# Patient Record
Sex: Male | Born: 1995 | Race: Black or African American | Hispanic: No | Marital: Single | Smoking: Never smoker
Health system: Southern US, Community
[De-identification: ages and names within clinical notes are randomized; demographics above are authoritative.]

## PROBLEM LIST (undated history)

## (undated) DIAGNOSIS — F909 Attention-deficit hyperactivity disorder, unspecified type: Secondary | ICD-10-CM

## (undated) DIAGNOSIS — J302 Other seasonal allergic rhinitis: Secondary | ICD-10-CM

## (undated) DIAGNOSIS — Q999 Chromosomal abnormality, unspecified: Secondary | ICD-10-CM

## (undated) DIAGNOSIS — E119 Type 2 diabetes mellitus without complications: Secondary | ICD-10-CM

## (undated) DIAGNOSIS — J40 Bronchitis, not specified as acute or chronic: Secondary | ICD-10-CM

## (undated) DIAGNOSIS — F913 Oppositional defiant disorder: Secondary | ICD-10-CM

## (undated) DIAGNOSIS — G4733 Obstructive sleep apnea (adult) (pediatric): Secondary | ICD-10-CM

## (undated) HISTORY — DX: Bronchitis, not specified as acute or chronic: J40

## (undated) HISTORY — DX: Oppositional defiant disorder: F91.3

## (undated) HISTORY — DX: Attention-deficit hyperactivity disorder, unspecified type: F90.9

## (undated) HISTORY — DX: Other seasonal allergic rhinitis: J30.2

## (undated) HISTORY — DX: Type 2 diabetes mellitus without complications: E11.9

## (undated) HISTORY — DX: Chromosomal abnormality, unspecified: Q99.9

## (undated) HISTORY — DX: Obstructive sleep apnea (adult) (pediatric): G47.33

## (undated) HISTORY — PX: TONSILLECTOMY AND ADENOIDECTOMY: SUR1326

## (undated) HISTORY — PX: OTHER SURGICAL HISTORY: SHX169

---
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
## 2015-01-01 ENCOUNTER — Ambulatory Visit (INDEPENDENT_AMBULATORY_CARE_PROVIDER_SITE_OTHER): Payer: MEDICAID | Admitting: Psychiatry

## 2015-01-01 ENCOUNTER — Encounter (HOSPITAL_COMMUNITY): Payer: Self-pay | Admitting: Psychiatry

## 2015-01-01 VITALS — BP 127/65 | HR 76 | Ht 67.81 in | Wt 189.4 lb

## 2015-01-01 DIAGNOSIS — F902 Attention-deficit hyperactivity disorder, combined type: Secondary | ICD-10-CM | POA: Diagnosis not present

## 2015-01-01 DIAGNOSIS — F913 Oppositional defiant disorder: Secondary | ICD-10-CM | POA: Diagnosis not present

## 2015-01-01 DIAGNOSIS — F39 Unspecified mood [affective] disorder: Secondary | ICD-10-CM

## 2015-01-01 MED ORDER — DEXMETHYLPHENIDATE HCL ER 35 MG PO CP24: 35.0000 mg | ORAL_CAPSULE | ORAL | Status: DC

## 2015-01-01 MED ORDER — GUANFACINE HCL ER 4 MG PO TB24: 4.0000 mg | ORAL_TABLET | ORAL | Status: DC

## 2015-01-01 MED ORDER — DEXMETHYLPHENIDATE HCL ER 35 MG PO CP24: ORAL_CAPSULE | ORAL | Status: DC

## 2015-01-01 NOTE — Progress Notes (Signed)
Patient ID: Aaron Dean, male   DOB: [birth year], 19 y.o.   MRN: [id number] Patient ID: Aaron Dean, male   DOB: [birth year], 19 y.o.   MRN: [id number] Patient ID: Aaron Dean, male   DOB: [birth year], 19 y.o.   MRN: [id number] Patient ID: Aaron Dean, male   DOB: [birth year], 19 y.o.   MRN: [id number] Patient ID: Aaron Dean, male   DOB: [birth year], 19 y.o.   MRN: [id number] Patient ID: Aaron Dean, male   DOB: [birth year], 19 y.o.   MRN: [id number] Patient ID: Aaron Dean, male   DOB: [birth year], 19 y.o.   MRN: [id number] [facility] [phone] Progress Note Aaron Dean MRN: [id number] DOB: [birth year] Age: 19 y.o.  Date: 01/01/2015 Start Time: 3:30 PM End Time: 3:55 PM  Chief Complaint: Chief Complaint  Patient presents with  . ADHD  . Follow-up   Subjective: "I am doing okay"  This patient is a 19 year-old black male who lives with his mother grandmother and 2 uncles in [place]. 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  Apparently the patient was born at 28 weeks due to placenta disruption. He was in the NICU for 4 months. He had delayed milestones and needed speech and physical therapy. 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  The patient used to have a lot of disruptive agitated behaviors but he is doing much better now on a combination of intuitive and Focalin he's not had any significant side effects. He does have an IEP at school and his grades are usually C's and D's  The patient returns for followup after 3 months. For the most part he is doing okay . Grades are okay except a D in [place]. He not had any behavioral problems at home or school     Suicidal Ideation: No Plan Formed: No Patient has means to carry out plan: No  Homicidal Ideation: No Plan Formed: No Patient has means to carry out plan: No  Review of  Systems: Psychiatric: Agitation: No Hallucination: No Depressed Mood: No Insomnia: No Hypersomnia: No Altered Concentration: No Feels Worthless: No Grandiose Ideas: No Belief In Special Powers: No New/Increased Substance Abuse: No Compulsions: No  Neurologic: Headache: No Seizure: No Paresthesias: No  Past Medical Family, Social History: In the 12th grade at [employer] and has an IEP Allergies: Allergies  Allergen Reactions  . Abilify [Aripiprazole] Other (See Comments)    Altered mental state  . Amoxicillin-Pot Clavulanate Rash   Medical History: Past Medical History  Diagnosis Date  . ADHD (attention deficit hyperactivity disorder)   . Oppositional defiant disorder   . Premature birth   . Bronchitis   . Chromosomal abnormality   . Sleep apnoea, obstructed   . Seasonal allergies   . Diabetes mellitus type II   . Asthma     seasonal related   Surgical History: Past Surgical History  Procedure Laterality Date  . Tubes in ears during early childhood    . Tonsillectomy and adenoidectomy     Family History: family history includes Alcohol abuse in his father; Anxiety disorder in his maternal grandmother; Autism spectrum disorder in his cousin; Drug abuse in his father; Seizures in his paternal uncle. There is no history of ADD / ADHD, Bipolar disorder, Dementia, Depression, OCD, Paranoid behavior, Schizophrenia, Physical abuse, or Sexual abuse. Reviewed and nothing  new today.  Outpatient Encounter Prescriptions as of 01/01/2015  Medication Sig  . cetirizine (ZYRTEC) 10 MG tablet Take 10 mg by mouth daily.  Marland Kitchen FOCALIN XR 15 MG 24 hr capsule Take 15 mg by mouth every morning.  Marland Kitchen FOCALIN XR 20 MG 24 hr capsule Take 20 mg by mouth every morning.  Marland Kitchen guaiFENesin (DIABETIC TUSSIN EX) 100 MG/5ML liquid Take 200 mg by mouth once as needed. For cough/cold  . guanFACINE (INTUNIV) 4 MG TB24 SR tablet Take 1 tablet (4 mg total) by mouth every morning.  . metFORMIN  (GLUCOPHAGE-XR) 750 MG 24 hr tablet Take 1,500 mg by mouth daily with breakfast.  . tretinoin (RETIN-A) 0.025 % cream Apply 1 application topically daily as needed. For skin breakouts  . [DISCONTINUED] guanFACINE (INTUNIV) 4 MG TB24 SR tablet Take 1 tablet (4 mg total) by mouth every morning.  Marland Kitchen Dexmethylphenidate HCl 35 MG CP24 Take 35 mg by mouth every morning.  Marland Kitchen Dexmethylphenidate HCl 35 MG CP24 Take one each am  . Dexmethylphenidate HCl 35 MG CP24 Take 35 mg by mouth every morning.  . [DISCONTINUED] Dexmethylphenidate HCl 35 MG CP24 Take one each am (Patient not taking: Reported on 01/01/2015)  . [DISCONTINUED] Dexmethylphenidate HCl 35 MG CP24 Take 35 mg by mouth every morning. (Patient not taking: Reported on 01/01/2015)  . [DISCONTINUED] Dexmethylphenidate HCl 35 MG CP24 Take 35 mg by mouth every morning. (Patient not taking: Reported on 01/01/2015)    Past Psychiatric History/Hospitalization(s): Anxiety: No Bipolar Disorder: Yes Depression: No Mania: No Psychosis: No Schizophrenia: No Personality Disorder: No Hospitalization for psychiatric illness: No History of Electroconvulsive Shock Therapy: No Prior Suicide Attempts: No  Physical Exam: Constitutional:  BP 127/65 mmHg  Pulse 76  Ht 5' 7.81" (1.722 m)  Wt 189 lb 6.4 oz (85.911 kg)  BMI 28.97 kg/m2  General Appearance: alert, oriented, no acute distress and obese  Musculoskeletal: Strength & Muscle Tone: within normal limits Gait & Station: normal Patient leans: N/A  Psychiatric: Speech (describe rate, volume, coherence, spontaneity, and abnormalities if any): Normal in volume, rate, tone, spontaneous   Thought Process (describe rate, content, abstract reasoning, and computation): Organized, goal directed, age appropriate   Associations: Intact  Thoughts: normal  Mental Status: Orientation: oriented to person, place and situation Mood & Affect: normal affect Attention Span & Concentration: OK  Lab Results:   No results found for this or any previous visit (from the past 8736 hour(s)).Labs are good.  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  I reviewed CC, tobacco/med/surg Hx, meds effects/ side effects, problem list, therapies and responses as well as current situation/symptoms discussed options. He'll continue his current medications and return in 3 months See orders and pt instructions for more details.  MEDICATIONS this encounter: Meds ordered this encounter  Medications  . FOCALIN XR 20 MG 24 hr capsule    Sig: Take 20 mg by mouth every morning.    Refill:  0  . FOCALIN XR 15 MG 24 hr capsule    Sig: Take 15 mg by mouth every morning.    Refill:  0  . guanFACINE (INTUNIV) 4 MG TB24 SR tablet    Sig: Take 1 tablet (4 mg total) by mouth every morning.  Dispense:  30 tablet    Refill:  2  . Dexmethylphenidate HCl 35 MG CP24    Sig: Take 35 mg by mouth every morning.    Dispense:  30 capsule    Refill:  0    Do not fill before 02/01/15  . Dexmethylphenidate HCl 35 MG CP24    Sig: Take one each am    Dispense:  30 capsule    Refill:  0  . Dexmethylphenidate HCl 35 MG CP24    Sig: Take 35 mg by mouth every morning.    Dispense:  30 capsule    Refill:  0    Do not fill before 03/02/15    Medical Decision Making Problem Points:  Established problem, worsening (2), Review of last therapy session (1) and Review of psycho-social stressors (1) Data Points:  Review or order clinical lab tests (1) Review of medication regiment & side effects (2) Review of new medications or change in dosage (2)  I certify that outpatient services furnished can reasonably be expected to improve the patient's condition.   [name],  MD

## 2015-04-02 ENCOUNTER — Ambulatory Visit (INDEPENDENT_AMBULATORY_CARE_PROVIDER_SITE_OTHER): Payer: MEDICAID | Admitting: Psychiatry

## 2015-04-02 ENCOUNTER — Encounter (HOSPITAL_COMMUNITY): Payer: Self-pay | Admitting: Psychiatry

## 2015-04-02 VITALS — Ht 67.0 in | Wt 201.0 lb

## 2015-04-02 DIAGNOSIS — F39 Unspecified mood [affective] disorder: Secondary | ICD-10-CM | POA: Diagnosis not present

## 2015-04-02 DIAGNOSIS — F902 Attention-deficit hyperactivity disorder, combined type: Secondary | ICD-10-CM

## 2015-04-02 DIAGNOSIS — F913 Oppositional defiant disorder: Secondary | ICD-10-CM

## 2015-04-02 MED ORDER — GUANFACINE HCL ER 4 MG PO TB24: 4.0000 mg | ORAL_TABLET | ORAL | Status: DC

## 2015-04-02 MED ORDER — DEXMETHYLPHENIDATE HCL ER 35 MG PO CP24: 35.0000 mg | ORAL_CAPSULE | ORAL | Status: DC

## 2015-04-02 MED ORDER — DEXMETHYLPHENIDATE HCL ER 35 MG PO CP24: ORAL_CAPSULE | ORAL | Status: DC

## 2015-04-02 NOTE — Progress Notes (Signed)
Patient ID: Aaron CoppBraxton L Talamantez, male   DOB: 1996/12/05, 19 y.o.   MRN: [id number] Patient ID: Aaron CoppBraxton L Henslee, male   DOB: 1996/12/05, 19 y.o.   MRN: [id number] Patient ID: Aaron CoppBraxton L Larose, male   DOB: 1996/12/05, 19 y.o.   MRN: [id number] Patient ID: Aaron CoppBraxton L Georgiades, male   DOB: 1996/12/05, 19 y.o.   MRN: [id number] Patient ID: Aaron CoppBraxton L Texidor, male   DOB: 1996/12/05, 19 y.o.   MRN: [id number] Patient ID: Aaron CoppBraxton L Lahm, male   DOB: 1996/12/05, 19 y.o.   MRN: [id number] Patient ID: Aaron CoppBraxton L Slusher, male   DOB: 1996/12/05, 19 y.o.   MRN: [id number] Patient ID: Aaron CoppBraxton L Wild, male   DOB: 1996/12/05, 19 y.o.   MRN: [id number] [facility] [id number] Progress Note Aaron Dean MRN: [id number] DOB: 1996/12/05 Age: 19 y.o.  Date: 04/02/2015 Start Time: 3:30 PM End Time: 3:55 PM  Chief Complaint: Chief Complaint  Patient presents with  . ADHD  . Follow-up   Subjective: "I am doing okay"  This patient is a 19 year-old black male who lives with his mother grandmother and 2 uncles in [place]. 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  Apparently the patient was born at 28 weeks due to placenta disruption. He was in the NICU for 4 months. He had delayed milestones and needed speech and physical therapy. 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  The patient used to have a lot of disruptive agitated behaviors but he is doing much better now on a combination of intuitive and Focalin he's not had any significant side effects. He does have an IEP at school and his grades are usually C's and D's  The patient returns for followup after 3 months. For the most part he is doing okay his grades have dropped as hebrokehis chrome book and couldn't use it for a while it has now been fixed any plans to bring his grades up He not had any behavioral problems at home or school     Suicidal Ideation: No Plan  Formed: No Patient has means to carry out plan: No  Homicidal Ideation: No Plan Formed: No Patient has means to carry out plan: No  Review of Systems: Psychiatric: Agitation: No Hallucination: No Depressed Mood: No Insomnia: No Hypersomnia: No Altered Concentration: No Feels Worthless: No Grandiose Ideas: No Belief In Special Powers: No New/Increased Substance Abuse: No Compulsions: No  Neurologic: Headache: No Seizure: No Paresthesias: No  Past Medical Family, Social History: In the 12th grade at [employer] and has an IEP Allergies: Allergies  Allergen Reactions  . Abilify [Aripiprazole] Other (See Comments)    Altered mental state  . Amoxicillin-Pot Clavulanate Rash   Medical History: Past Medical History  Diagnosis Date  . ADHD (attention deficit hyperactivity disorder)   . Oppositional defiant disorder   . Premature birth   . Bronchitis   . Chromosomal abnormality   . Sleep apnoea, obstructed   . Seasonal allergies   . Diabetes mellitus type II   . Asthma     seasonal related   Surgical History: Past Surgical History  Procedure Laterality Date  . Tubes in ears during early childhood    . Tonsillectomy and adenoidectomy     Family History: family history includes Alcohol abuse in his father; Anxiety disorder in his maternal grandmother; Autism spectrum disorder in his cousin;  Drug abuse in his father; Seizures in his paternal uncle. There is no history of ADD / ADHD, Bipolar disorder, Dementia, Depression, OCD, Paranoid behavior, Schizophrenia, Physical abuse, or Sexual abuse. Reviewed and nothing new today.  Outpatient Encounter Prescriptions as of 04/02/2015  Medication Sig  . cetirizine (ZYRTEC) 10 MG tablet Take 10 mg by mouth daily.  Marland Kitchen Dexmethylphenidate HCl 35 MG CP24 Take 35 mg by mouth every morning.  Marland Kitchen Dexmethylphenidate HCl 35 MG CP24 Take 35 mg by mouth every morning.  Marland Kitchen Dexmethylphenidate HCl 35 MG CP24 Take one each am  . FOCALIN  XR 15 MG 24 hr capsule Take 15 mg by mouth every morning.  Marland Kitchen FOCALIN XR 20 MG 24 hr capsule Take 20 mg by mouth every morning.  Marland Kitchen guaiFENesin (DIABETIC TUSSIN EX) 100 MG/5ML liquid Take 200 mg by mouth once as needed. For cough/cold  . guanFACINE (INTUNIV) 4 MG TB24 SR tablet Take 1 tablet (4 mg total) by mouth every morning.  . metFORMIN (GLUCOPHAGE-XR) 750 MG 24 hr tablet Take 1,500 mg by mouth daily with breakfast.  . tretinoin (RETIN-A) 0.025 % cream Apply 1 application topically daily as needed. For skin breakouts  . [DISCONTINUED] Dexmethylphenidate HCl 35 MG CP24 Take 35 mg by mouth every morning.  . [DISCONTINUED] Dexmethylphenidate HCl 35 MG CP24 Take one each am  . [DISCONTINUED] Dexmethylphenidate HCl 35 MG CP24 Take 35 mg by mouth every morning.  . [DISCONTINUED] guanFACINE (INTUNIV) 4 MG TB24 SR tablet Take 1 tablet (4 mg total) by mouth every morning.    Past Psychiatric History/Hospitalization(s): Anxiety: No Bipolar Disorder: Yes Depression: No Mania: No Psychosis: No Schizophrenia: No Personality Disorder: No Hospitalization for psychiatric illness: No History of Electroconvulsive Shock Therapy: No Prior Suicide Attempts: No  Physical Exam: Constitutional:  Ht  (1.702 m)  Wt 201 lb (91.173 kg)  BMI 31.47 kg/m2  General Appearance: alert, oriented, no acute distress and obese  Musculoskeletal: Strength & Muscle Tone: within normal limits Gait & Station: normal Patient leans: N/A  Psychiatric: Speech (describe rate, volume, coherence, spontaneity, and abnormalities if any): Normal in volume, rate, tone, spontaneous   Thought Process (describe rate, content, abstract reasoning, and computation): Organized, goal directed, age appropriate   Associations: Intact  Thoughts: normal  Mental Status: Orientation: oriented to person, place and situation Mood & Affect: normal affect Attention Span & Concentration: OK  Lab Results:  No results found for  this or any previous visit (from the past 8736 hour(s)).Labs are good.  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  I reviewed CC, tobacco/med/surg Hx, meds effects/ side effects, problem list, therapies and responses as well as current situation/symptoms discussed options. He'll continue his current medications and return in 3 months See orders and pt instructions for more details.  MEDICATIONS this encounter: Meds ordered this encounter  Medications  . guanFACINE (INTUNIV) 4 MG TB24 SR tablet    Sig: Take 1 tablet (4 mg total) by mouth every morning.    Dispense:  30 tablet    Refill:  2  . Dexmethylphenidate HCl 35 MG CP24    Sig: Take 35 mg by mouth every morning.    Dispense:  30 capsule    Refill:  0  . Dexmethylphenidate HCl  35 MG CP24    Sig: Take 35 mg by mouth every morning.    Dispense:  30 capsule    Refill:  0    Do not fill before 05/02/15  . Dexmethylphenidate HCl 35 MG CP24    Sig: Take one each am    Dispense:  30 capsule    Refill:  0    Do not fill before 06/02/15    Medical Decision Making Problem Points:  Established problem, worsening (2), Review of last therapy session (1) and Review of psycho-social stressors (1) Data Points:  Review or order clinical lab tests (1) Review of medication regiment & side effects (2) Review of new medications or change in dosage (2)  I certify that outpatient services furnished can reasonably be expected to improve the patient's condition.   [name], MD

## 2015-06-29 ENCOUNTER — Other Ambulatory Visit (HOSPITAL_COMMUNITY): Payer: Self-pay | Admitting: Psychiatry

## 2015-06-30 ENCOUNTER — Other Ambulatory Visit (HOSPITAL_COMMUNITY): Payer: Self-pay | Admitting: Psychiatry

## 2015-06-30 ENCOUNTER — Telehealth (HOSPITAL_COMMUNITY): Payer: Self-pay | Admitting: *Deleted

## 2015-06-30 MED ORDER — DEXMETHYLPHENIDATE HCL ER 35 MG PO CP24
35.0000 mg | ORAL_CAPSULE | ORAL | Status: DC
Start: 2015-06-30 — End: 2015-07-12

## 2015-06-30 NOTE — Telephone Encounter (Signed)
printed

## 2015-06-30 NOTE — Telephone Encounter (Signed)
patient request a prescription for Focalin.  He will be one pill short.    He is scheduled to be seen on Friday.

## 2015-07-01 ENCOUNTER — Telehealth (HOSPITAL_COMMUNITY): Payer: Self-pay | Admitting: *Deleted

## 2015-07-01 NOTE — Telephone Encounter (Signed)
Pt mother Aaron Dean came into office to pick up pt printed script that was requested. Pt mother agreed with script. Pt mother D/L number is [phone] and exp date of 04-28-22.

## 2015-07-01 NOTE — Telephone Encounter (Signed)
Informed pt about printed script. Pt showed understanding

## 2015-07-02 ENCOUNTER — Ambulatory Visit (HOSPITAL_COMMUNITY): Payer: Self-pay | Admitting: Psychiatry

## 2015-07-05 ENCOUNTER — Telehealth (HOSPITAL_COMMUNITY): Payer: Self-pay | Admitting: *Deleted

## 2015-07-05 NOTE — Telephone Encounter (Signed)
VOICE MESSAGE FROM LESLIE CARTER, [place] NEED REFILL OF INTUNIV.   HIS MEDICAID HAS BEEN APPROVED.   HE HAS BEEN OUT FOR FOUR DAYS.

## 2015-07-06 ENCOUNTER — Other Ambulatory Visit (HOSPITAL_COMMUNITY): Payer: Self-pay | Admitting: Psychiatry

## 2015-07-06 ENCOUNTER — Telehealth (HOSPITAL_COMMUNITY): Payer: Self-pay | Admitting: *Deleted

## 2015-07-06 DIAGNOSIS — F902 Attention-deficit hyperactivity disorder, combined type: Secondary | ICD-10-CM

## 2015-07-06 DIAGNOSIS — F913 Oppositional defiant disorder: Secondary | ICD-10-CM

## 2015-07-06 MED ORDER — GUANFACINE HCL ER 4 MG PO TB24: 4.0000 mg | ORAL_TABLET | ORAL | Status: DC

## 2015-07-06 NOTE — Telephone Encounter (Signed)
FAX REQUEST FOR GUANFACINE HCL ER 4 MT. TABLET

## 2015-07-06 NOTE — Telephone Encounter (Signed)
sent 

## 2015-07-06 NOTE — Telephone Encounter (Signed)
Already sent.

## 2015-07-12 ENCOUNTER — Ambulatory Visit (INDEPENDENT_AMBULATORY_CARE_PROVIDER_SITE_OTHER): Payer: Medicaid Other | Admitting: Psychiatry

## 2015-07-12 ENCOUNTER — Ambulatory Visit (HOSPITAL_COMMUNITY): Payer: Self-pay | Admitting: Psychiatry

## 2015-07-12 ENCOUNTER — Encounter (HOSPITAL_COMMUNITY): Payer: Self-pay | Admitting: Psychiatry

## 2015-07-12 VITALS — BP 120/80 | Ht 66.5 in | Wt 188.0 lb

## 2015-07-12 DIAGNOSIS — F902 Attention-deficit hyperactivity disorder, combined type: Secondary | ICD-10-CM

## 2015-07-12 DIAGNOSIS — F913 Oppositional defiant disorder: Secondary | ICD-10-CM

## 2015-07-12 DIAGNOSIS — F39 Unspecified mood [affective] disorder: Secondary | ICD-10-CM | POA: Diagnosis not present

## 2015-07-12 MED ORDER — DEXMETHYLPHENIDATE HCL ER 35 MG PO CP24: 35.0000 mg | ORAL_CAPSULE | ORAL | Status: DC

## 2015-07-12 MED ORDER — DEXMETHYLPHENIDATE HCL ER 35 MG PO CP24: ORAL_CAPSULE | ORAL | Status: DC

## 2015-07-12 MED ORDER — GUANFACINE HCL ER 4 MG PO TB24: 4.0000 mg | ORAL_TABLET | ORAL | Status: DC

## 2015-07-12 NOTE — Progress Notes (Signed)
Patient ID: Aaron Dean, male   DOB: 1996/05/21, 19 y.o.   MRN: [id number] Patient ID: Aaron Dean, male   DOB: [birth year], 19 y.o.   MRN: [id number] Patient ID: Aaron Dean, male   DOB: 28-Apr-1996, 19 y.o.   MRN: [id number] Patient ID: Aaron Dean, male   DOB: [birth year], 19 y.o.   MRN: [id number] Patient ID: Aaron Dean, male   DOB: [birth year], 19 y.o.   MRN: [id number] Patient ID: Aaron Dean, male   DOB: 06-Dec-1996, 19 y.o.   MRN: [id number] Patient ID: Aaron Dean, male   DOB: 09/05/1996, 19 y.o.   MRN: [id number] Patient ID: Aaron Dean, male   DOB: April 10, 1996, 19 y.o.   MRN: [id number] Patient ID: Aaron Dean, male   DOB: 1996/10/24, 19 y.o.   MRN: [id number] [facility] [id number] Progress Note Aaron Dean MRN: [id number] DOB: July 08, 1996 Age: 19 y.o.  Date: 07/12/2015 Start Time: 3:30 PM End Time: 3:55 PM  Chief Complaint: Chief Complaint  Patient presents with  . ADHD  . Follow-up   Subjective: "I am doing okay"  This patient is a 19 year-old black male who lives with his mother grandmother and 2 uncles in [place]. He just completed the 12th grader at [employer].  Apparently the patient was born at 28 weeks due to placenta disruption. He was in the NICU for 4 months. He had delayed milestones and needed speech and physical therapy. 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  The patient used to have a lot of disruptive agitated behaviors but he is doing much better now on a combination of intuitive and Focalin he's not had any significant side effects. He does have an IEP at school and his grades are usually C's and D's  The patient returns for followup after 3 months. He just completed the 12th grade and actually did okay with mostly B's and C's in a couple of these. He is now going to a remedial program at [employer]. Eventually he will start college courses. He is not yet driving and his mother takes him to school. He has been behaving well at home     Suicidal Ideation: No Plan Formed: No Patient has means to carry out plan: No  Homicidal Ideation: No Plan Formed: No Patient has means to carry out plan: No  Review of Systems: Psychiatric: Agitation: No Hallucination: No Depressed Mood: No Insomnia: No Hypersomnia: No Altered Concentration: No Feels Worthless: No Grandiose Ideas: No Belief In Special Powers: No New/Increased Substance Abuse: No Compulsions: No  Neurologic: Headache: No Seizure: No Paresthesias: No  Past Medical Family, Social History: In the 12th grade at [employer] school and has an IEP Allergies: Allergies  Allergen Reactions  . Abilify [Aripiprazole] Other (See Comments)    Altered mental state  . Amoxicillin-Pot Clavulanate Rash   Medical History: Past Medical History  Diagnosis Date  . ADHD (attention deficit hyperactivity disorder)   . Oppositional defiant disorder   . Premature birth   . Bronchitis   . Chromosomal abnormality   . Sleep apnoea, obstructed   . Seasonal allergies   . Diabetes mellitus type II   . Asthma     seasonal related   Surgical History: Past Surgical History  Procedure Laterality Date  . Tubes in ears during early childhood    . Tonsillectomy  and adenoidectomy     Family History: family history includes Alcohol abuse in his father; Anxiety disorder in his maternal grandmother; Autism spectrum disorder in his cousin; Drug abuse in his father; Seizures in his paternal uncle. There is no history of ADD / ADHD, Bipolar disorder, Dementia, Depression, OCD, Paranoid behavior, Schizophrenia, Physical abuse, or Sexual abuse. Reviewed and nothing new today.  Outpatient Encounter Prescriptions as of 07/12/2015  Medication Sig  . cetirizine (ZYRTEC) 10 MG tablet Take 10 mg by mouth daily.  Marland Kitchen. Dexmethylphenidate HCl 35 MG  CP24 Take 35 mg by mouth every morning.  Marland Kitchen. Dexmethylphenidate HCl 35 MG CP24 Take one each am  . Dexmethylphenidate HCl 35 MG CP24 Take 35 mg by mouth every morning.  Marland Kitchen. guaiFENesin (DIABETIC TUSSIN EX) 100 MG/5ML liquid Take 200 mg by mouth once as needed. For cough/cold  . guanFACINE (INTUNIV) 4 MG TB24 SR tablet Take 1 tablet (4 mg total) by mouth every morning.  . metFORMIN (GLUCOPHAGE-XR) 750 MG 24 hr tablet Take 1,500 mg by mouth daily with breakfast.  . tretinoin (RETIN-A) 0.025 % cream Apply 1 application topically daily as needed. For skin breakouts  . [DISCONTINUED] Dexmethylphenidate HCl 35 MG CP24 Take 35 mg by mouth every morning.  . [DISCONTINUED] Dexmethylphenidate HCl 35 MG CP24 Take one each am  . [DISCONTINUED] Dexmethylphenidate HCl 35 MG CP24 Take 35 mg by mouth every morning.  . [DISCONTINUED] FOCALIN XR 15 MG 24 hr capsule Take 15 mg by mouth every morning.  . [DISCONTINUED] FOCALIN XR 20 MG 24 hr capsule Take 20 mg by mouth every morning.  . [DISCONTINUED] guanFACINE (INTUNIV) 4 MG TB24 SR tablet Take 1 tablet (4 mg total) by mouth every morning.   No facility-administered encounter medications on file as of 07/12/2015.    Past Psychiatric History/Hospitalization(s): Anxiety: No Bipolar Disorder: Yes Depression: No Mania: No Psychosis: No Schizophrenia: No Personality Disorder: No Hospitalization for psychiatric illness: No History of Electroconvulsive Shock Therapy: No Prior Suicide Attempts: No  Physical Exam: Constitutional:  BP 120/80 mmHg  Ht 5' 6.5" (1.689 m)  Wt 188 lb (85.276 kg)  BMI 29.89 kg/m2  General Appearance: alert, oriented, no acute distress and obese  Musculoskeletal: Strength & Muscle Tone: within normal limits Gait & Station: normal Patient leans: N/A  Psychiatric: Speech (describe rate, volume, coherence, spontaneity, and abnormalities if any): Normal in volume, rate, tone, spontaneous   Thought Process (describe rate, content,  abstract reasoning, and computation): Organized, goal directed, age appropriate   Associations: Intact  Thoughts: normal  Mental Status: Orientation: oriented to person, place and situation Mood & Affect: normal affect Attention Span & Concentration: OK  Lab Results:  No results found for this or any previous visit (from the past 8736 hour(s)).Labs are good.  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  I reviewed CC, tobacco/med/surg Hx, meds effects/ side effects, problem list, therapies and responses as well as current situation/symptoms discussed options. He'll continue Focalin and Intuniv for ADHD symptoms and return in 3 months See orders and pt instructions for more details.  MEDICATIONS this encounter: Meds ordered this encounter  Medications  . guanFACINE (INTUNIV) 4 MG TB24 SR tablet    Sig: Take 1 tablet (4 mg total) by mouth every morning.  Dispense:  30 tablet    Refill:  2  . Dexmethylphenidate HCl 35 MG CP24    Sig: Take 35 mg by mouth every morning.    Dispense:  30 capsule    Refill:  0  . Dexmethylphenidate HCl 35 MG CP24    Sig: Take one each am    Dispense:  30 capsule    Refill:  0    Do not fill before 08/12/15  . Dexmethylphenidate HCl 35 MG CP24    Sig: Take 35 mg by mouth every morning.    Dispense:  30 capsule    Refill:  0    Do not fill before 09/12/15    Medical Decision Making Problem Points:  Established problem, worsening (2), Review of last therapy session (1) and Review of psycho-social stressors (1) Data Points:  Review or order clinical lab tests (1) Review of medication regiment & side effects (2) Review of new medications or change in dosage (2)  I certify that  outpatient services furnished can reasonably be expected to improve the patient's condition.   [name], MD

## 2015-07-16 ENCOUNTER — Telehealth (HOSPITAL_COMMUNITY): Payer: Self-pay | Admitting: *Deleted

## 2015-07-16 NOTE — Telephone Encounter (Signed)
Please schedule him with Gigi Gin, she has seen him before

## 2015-07-16 NOTE — Telephone Encounter (Signed)
phone call from Patient's grandmother passed yesterday, and he keep crying.  she wants to know what to do.

## 2015-07-16 NOTE — Telephone Encounter (Signed)
Scheduled appt with pt mother and she showed understanding

## 2015-08-05 ENCOUNTER — Ambulatory Visit (INDEPENDENT_AMBULATORY_CARE_PROVIDER_SITE_OTHER): Payer: Medicaid Other | Admitting: Psychiatry

## 2015-08-05 DIAGNOSIS — F4321 Adjustment disorder with depressed mood: Secondary | ICD-10-CM

## 2015-08-05 NOTE — Patient Instructions (Signed)
Discussed orally 

## 2015-08-05 NOTE — Progress Notes (Signed)
   THERAPIST PROGRESS NOTE  Session Time: Thursday 06/04/2014 4:05 PM - 4:30 PM  Participation Level: Active  Behavioral Response: CasualAlert/Sad  Type of Therapy: Individual Therapy  Treatment Goals addressed:  Begin healthy grieving process around the loss of grandmother  Interventions: Supportive  Summary: Aaron Dean is a 19 y.o. male who presents with a long-standing history of ADHD and ODD. He last was seen in June 2015.  Psychiatrist Dr. Tenny Craw has referred patient for services to cope with the recent death of his grandmother. Per patient's report, his 73 year old grandmother died on 08/13/2015. Patient had a very close relationship with grandmother who resided with patient and his mother. She had serious health issues and multiple hospitalizations. Patient reports becoming upset and "losing it" when he heard about her death. He states he is better now as he has people around him who care about him. He states not wanting to talk about her too much because he may start crying. He says he is glad she saw him graduate. He also states he knew she wouldn't live long after her graduation but did not think it would happen this quick. He says he keeps remembering her words of telling him to keep going should something happen to her. He is starting [employer] for regular college classes next week and says his grandmother wanted him to go to college. Patient also shares positive memories of his grandmother.    Suicidal/Homicidal: No  Therapist Response: Therapist works with patient to process feelings, tell the story of the loss of his grandmother, discuss the rituals he participated in to grieve the loss of his grandmother, discuss the role of grieving and expression of feelings in managing the loss,   Plan:  Patient thanks therapist for listening today but says he is much better. He states he thinks he has gotten out his feelings and declines offer to schedule another app[ointment. Therapist  encourages patient to contact this practice should he decide to pursue therapy in the future.   Diagnosis: Axis I: Adjustment Disorder with Depressed Mood    ADHD         Axis II: No diagnosis    BYNUM,PEGGY, LCSW 08/05/2015

## 2015-10-02 ENCOUNTER — Other Ambulatory Visit (HOSPITAL_COMMUNITY): Payer: Self-pay | Admitting: Psychiatry

## 2015-10-06 ENCOUNTER — Other Ambulatory Visit (HOSPITAL_COMMUNITY): Payer: Self-pay | Admitting: Psychiatry

## 2015-10-06 ENCOUNTER — Telehealth (HOSPITAL_COMMUNITY): Payer: Self-pay | Admitting: *Deleted

## 2015-10-06 DIAGNOSIS — F902 Attention-deficit hyperactivity disorder, combined type: Secondary | ICD-10-CM

## 2015-10-06 DIAGNOSIS — F913 Oppositional defiant disorder: Secondary | ICD-10-CM

## 2015-10-06 MED ORDER — GUANFACINE HCL ER 4 MG PO TB24: 4.0000 mg | ORAL_TABLET | ORAL | Status: DC

## 2015-10-06 NOTE — Telephone Encounter (Signed)
sent 

## 2015-10-06 NOTE — Telephone Encounter (Signed)
noted 

## 2015-10-06 NOTE — Telephone Encounter (Signed)
Phone call from patient's mother, she said the pharmacy said his Intuniv was last given on 09/02/15.    He is out of Intuniv and need a refill.

## 2015-10-06 NOTE — Telephone Encounter (Signed)
Pt and his mother called stating pt is out of his Intuniv. Informed pt and his mother that per pt chart, pt should run out of his medication on 10-12-15. Called pt pharmacy and she informed office that pt last pick up was 09-02-15 and he does not have any refills on hold. Pt mother number is [phone].

## 2015-10-06 NOTE — Telephone Encounter (Signed)
Called Molly at pt pharmacy to f/u and make sure pt medication was received. Per Kirt BoysMolly, pharmacy did receive pt medication.

## 2015-10-12 ENCOUNTER — Ambulatory Visit (INDEPENDENT_AMBULATORY_CARE_PROVIDER_SITE_OTHER): Payer: Medicaid Other | Admitting: Psychiatry

## 2015-10-12 ENCOUNTER — Encounter (HOSPITAL_COMMUNITY): Payer: Self-pay | Admitting: Psychiatry

## 2015-10-12 VITALS — BP 111/73 | HR 67 | Ht 66.5 in | Wt 212.8 lb

## 2015-10-12 DIAGNOSIS — F39 Unspecified mood [affective] disorder: Secondary | ICD-10-CM | POA: Diagnosis not present

## 2015-10-12 DIAGNOSIS — F902 Attention-deficit hyperactivity disorder, combined type: Secondary | ICD-10-CM

## 2015-10-12 DIAGNOSIS — F4321 Adjustment disorder with depressed mood: Secondary | ICD-10-CM

## 2015-10-12 DIAGNOSIS — F913 Oppositional defiant disorder: Secondary | ICD-10-CM

## 2015-10-12 MED ORDER — DEXMETHYLPHENIDATE HCL ER 35 MG PO CP24: ORAL_CAPSULE | ORAL | Status: DC

## 2015-10-12 MED ORDER — DEXMETHYLPHENIDATE HCL ER 35 MG PO CP24: 35.0000 mg | ORAL_CAPSULE | ORAL | Status: DC

## 2015-10-12 NOTE — Progress Notes (Signed)
Patient ID: Aaron CoppBraxton L Tropea, male   DOB: [birth year], 19 y.o.   MRN: [id number] Patient ID: Aaron CoppBraxton L Almond, male   DOB: [birth year], 19 y.o.   MRN: [id number] Patient ID: Aaron CoppBraxton L Ditmars, male   DOB: [birth year], 19 y.o.   MRN: [id number] Patient ID: Aaron CoppBraxton L Arai, male   DOB: [birth year], 19 y.o.   MRN: [id number] Patient ID: Aaron CoppBraxton L Nicks, male   DOB: [birth year], 19 y.o.   MRN: [id number] Patient ID: Aaron CoppBraxton L Nation, male   DOB: [birth year], 19 y.o.   MRN: [id number] Patient ID: Aaron CoppBraxton L Dolliver, male   DOB: [birth year], 19 y.o.   MRN: [id number] Patient ID: Aaron CoppBraxton L Cassarino, male   DOB: [birth year], 19 y.o.   MRN: [id number] Patient ID: Aaron CoppBraxton L Blum, male   DOB: [birth year], 19 y.o.   MRN: [id number] Patient ID: Aaron CoppBraxton L Easterly, male   DOB: [birth year], 19 y.o.   MRN: [id number] [facility] [id number] Progress Note Aaron Dean MRN: [id number] DOB: [birth year] Age: 19 y.o.  Date: 10/12/2015 Start Time: 3:30 PM End Time: 3:55 PM  Chief Complaint: Chief Complaint  Patient presents with  . ADHD  . Follow-up   Subjective: "I am doing okay"  This patient is a 19 year-old black male who lives with his mother grandmother and 2 uncles in [place]. He is taking [facility] [employer]  Apparently the patient was born at 28 weeks due to placenta disruption. He was in the NICU for 4 months. He had delayed milestones and needed speech and physical therapy. 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  The patient used to have a lot of disruptive agitated behaviors but he is doing much better now on a combination of intuitive and Focalin he's not had any significant side effects. He does have an IEP at school and his grades are usually C's and D's  The patient returns for followup after 3 months.  He is now going to a remedial program at [employer]. Eventually he will start college courses. He  is not yet driving and his mother takes him to school. He has been behaving well at home. He comes in alone and does not relate any problems with temper or getting along with others. He states that he is enjoying college and getting decent grades.     Suicidal Ideation: No Plan Formed: No Patient has means to carry out plan: No  Homicidal Ideation: No Plan Formed: No Patient has means to carry out plan: No  Review of Systems: Psychiatric: Agitation: No Hallucination: No Depressed Mood: No Insomnia: No Hypersomnia: No Altered Concentration: No Feels Worthless: No Grandiose Ideas: No Belief In Special Powers: No New/Increased Substance Abuse: No Compulsions: No  Neurologic: Headache: No Seizure: No Paresthesias: No  Past Medical Family, Social History: In the 12th grade at [employer] and has an IEP Allergies: Allergies  Allergen Reactions  . Abilify [Aripiprazole] Other (See Comments)    Altered mental state  . Amoxicillin-Pot Clavulanate Rash   Medical History: Past Medical History  Diagnosis Date  . ADHD (attention deficit hyperactivity disorder)   . Oppositional defiant disorder   . Premature birth   . Bronchitis   . Chromosomal abnormality   . Sleep apnoea, obstructed   . Seasonal allergies   . Diabetes mellitus type II   . Asthma     seasonal related   Surgical  History: Past Surgical History  Procedure Laterality Date  . Tubes in ears during early childhood    . Tonsillectomy and adenoidectomy     Family History: family history includes Alcohol abuse in his father; Anxiety disorder in his maternal grandmother; Autism spectrum disorder in his cousin; Drug abuse in his father; Seizures in his paternal uncle. There is no history of ADD / ADHD, Bipolar disorder, Dementia, Depression, OCD, Paranoid behavior, Schizophrenia, Physical abuse, or Sexual abuse. Reviewed and nothing new today.  Outpatient Encounter Prescriptions as of 10/12/2015   Medication Sig  . cetirizine (ZYRTEC) 10 MG tablet Take 10 mg by mouth daily.  Marland Kitchen Dexmethylphenidate HCl 35 MG CP24 Take 35 mg by mouth every morning.  Marland Kitchen guaiFENesin (DIABETIC TUSSIN EX) 100 MG/5ML liquid Take 200 mg by mouth once as needed. For cough/cold  . guanFACINE (INTUNIV) 4 MG TB24 SR tablet Take 1 tablet (4 mg total) by mouth every morning.  . metFORMIN (GLUCOPHAGE-XR) 750 MG 24 hr tablet Take 1,500 mg by mouth daily with breakfast.  . tretinoin (RETIN-A) 0.025 % cream Apply 1 application topically daily as needed. For skin breakouts  . [DISCONTINUED] Dexmethylphenidate HCl 35 MG CP24 Take 35 mg by mouth every morning.  Marland Kitchen Dexmethylphenidate HCl 35 MG CP24 Take 35 mg by mouth every morning.  Marland Kitchen Dexmethylphenidate HCl 35 MG CP24 Take one each am  . [DISCONTINUED] Dexmethylphenidate HCl 35 MG CP24 Take one each am (Patient not taking: Reported on 10/12/2015)  . [DISCONTINUED] Dexmethylphenidate HCl 35 MG CP24 Take 35 mg by mouth every morning. (Patient not taking: Reported on 10/12/2015)   No facility-administered encounter medications on file as of 10/12/2015.    Past Psychiatric History/Hospitalization(s): Anxiety: No Bipolar Disorder: Yes Depression: No Mania: No Psychosis: No Schizophrenia: No Personality Disorder: No Hospitalization for psychiatric illness: No History of Electroconvulsive Shock Therapy: No Prior Suicide Attempts: No  Physical Exam: Constitutional:  BP 111/73 mmHg  Pulse 67  Ht 5' 6.5" (1.689 m)  Wt 212 lb 12.8 oz (96.525 kg)  BMI 33.84 kg/m2  SpO2 97%  General Appearance: alert, oriented, no acute distress and obese  Musculoskeletal: Strength & Muscle Tone: within normal limits Gait & Station: normal Patient leans: N/A  Psychiatric: Speech (describe rate, volume, coherence, spontaneity, and abnormalities if any): Normal in volume, rate, tone, spontaneous   Thought Process (describe rate, content, abstract reasoning, and computation):  Organized, goal directed, age appropriate   Associations: Intact  Thoughts: normal  Mental Status: Orientation: oriented to person, place and situation Mood & Affect: normal affect Attention Span & Concentration: OK  Lab Results:  No results found for this or any previous visit (from the past 8736 hour(s)).Labs are good.  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  I reviewed CC, tobacco/med/surg Hx, meds effects/ side effects, problem list, therapies and responses as well as current situation/symptoms discussed options. He'll continue Focalin and Intuniv for ADHD symptoms and return in 3 months See orders and pt instructions for more details.  MEDICATIONS this encounter: Meds ordered this encounter  Medications  . Dexmethylphenidate HCl 35 MG CP24    Sig: Take 35 mg by mouth every morning.    Dispense:  30 capsule    Refill:  0  . Dexmethylphenidate HCl 35  MG CP24    Sig: Take 35 mg by mouth every morning.    Dispense:  30 capsule    Refill:  0    Do not fill before 11/12/15  . Dexmethylphenidate HCl 35 MG CP24    Sig: Take one each am    Dispense:  30 capsule    Refill:  0    Do not fill before 12/12/15    Medical Decision Making Problem Points:  Established problem, worsening (2), Review of last therapy session (1) and Review of psycho-social stressors (1) Data Points:  Review or order clinical lab tests (1) Review of medication regiment & side effects (2) Review of new medications or change in dosage (2)  I certify that outpatient services furnished can reasonably be expected to improve the patient's condition.   [name], MD

## 2015-12-03 ENCOUNTER — Other Ambulatory Visit (HOSPITAL_COMMUNITY): Payer: Self-pay | Admitting: Psychiatry

## 2015-12-31 ENCOUNTER — Telehealth (HOSPITAL_COMMUNITY): Payer: Self-pay | Admitting: *Deleted

## 2015-12-31 ENCOUNTER — Other Ambulatory Visit (HOSPITAL_COMMUNITY): Payer: Self-pay | Admitting: Psychiatry

## 2015-12-31 DIAGNOSIS — F913 Oppositional defiant disorder: Secondary | ICD-10-CM

## 2015-12-31 DIAGNOSIS — F902 Attention-deficit hyperactivity disorder, combined type: Secondary | ICD-10-CM

## 2015-12-31 MED ORDER — GUANFACINE HCL ER 4 MG PO TB24: 4.0000 mg | ORAL_TABLET | ORAL | Status: DC

## 2015-12-31 NOTE — Telephone Encounter (Signed)
noted 

## 2015-12-31 NOTE — Telephone Encounter (Signed)
Pt pharmacy requesting refills for pt Guanfacine HCL ER 4 mg QD. Pt medication was last filled 10-06-15 with 2 refills. Pt pharmacy number is [phone].

## 2015-12-31 NOTE — Telephone Encounter (Signed)
sent 

## 2016-01-12 ENCOUNTER — Ambulatory Visit (INDEPENDENT_AMBULATORY_CARE_PROVIDER_SITE_OTHER): Payer: Medicaid Other | Admitting: Psychiatry

## 2016-01-12 ENCOUNTER — Encounter (HOSPITAL_COMMUNITY): Payer: Self-pay | Admitting: Psychiatry

## 2016-01-12 VITALS — BP 105/61 | HR 80 | Ht 66.53 in | Wt 209.8 lb

## 2016-01-12 DIAGNOSIS — F902 Attention-deficit hyperactivity disorder, combined type: Secondary | ICD-10-CM

## 2016-01-12 DIAGNOSIS — F913 Oppositional defiant disorder: Secondary | ICD-10-CM

## 2016-01-12 DIAGNOSIS — F39 Unspecified mood [affective] disorder: Secondary | ICD-10-CM | POA: Diagnosis not present

## 2016-01-12 MED ORDER — DEXMETHYLPHENIDATE HCL ER 35 MG PO CP24: ORAL_CAPSULE | ORAL | Status: DC

## 2016-01-12 MED ORDER — DEXMETHYLPHENIDATE HCL ER 35 MG PO CP24: 35.0000 mg | ORAL_CAPSULE | ORAL | Status: DC

## 2016-01-12 MED ORDER — GUANFACINE HCL ER 4 MG PO TB24: 4.0000 mg | ORAL_TABLET | ORAL | Status: DC

## 2016-01-12 NOTE — Progress Notes (Signed)
Patient ID: MAXIMIANO LOTT, male   DOB: 08/15/1996, 20 y.o.   MRN: [id number] Patient ID: CIPRIANO MILLIKAN, male   DOB: 1996/05/13, 20 y.o.   MRN: [id number] Patient ID: SCOTTI KOSTA, male   DOB: 26-Oct-1996, 20 y.o.   MRN: [id number] Patient ID: DINERO CHAVIRA, male   DOB: 1996/11/29, 20 y.o.   MRN: [id number] Patient ID: MARQUIST BINSTOCK, male   DOB: [birth year], 20 y.o.   MRN: [id number] Patient ID: JOSHUAL TERRIO, male   DOB: 09-14-1996, 20 y.o.   MRN: [id number] Patient ID: ROMOND PIPKINS, male   DOB: 1996-12-02, 20 y.o.   MRN: [id number] Patient ID: DELWIN RACZKOWSKI, male   DOB: [birth year], 20 y.o.   MRN: [id number] Patient ID: LOVELACE CERVENY, male   DOB: September 27, 1996, 20 y.o.   MRN: [id number] Patient ID: THEADOR JEZEWSKI, male   DOB: 1996/08/28, 20 y.o.   MRN: [id number] Patient ID: NAETHAN BRACEWELL, male   DOB: [birth year], 20 y.o.   MRN: [id number] [facility] [id number] Progress Note KRISTJAN DERNER MRN: [id number] DOB: 22-Apr-1996 Age: 20 y.o.  Date: 01/12/2016 Start Time: 3:30 PM End Time: 3:55 PM  Chief Complaint: Chief Complaint  Patient presents with  . ADHD  . Follow-up   Subjective: "I am doing okay"  This patient is a 20 year-old black male who lives with his mother grandmother and 2 uncles in [place]. He is taking classes at [employer]  Apparently the patient was born at 28 weeks due to placenta disruption. He was in the NICU for 4 months. He had delayed milestones and needed speech and physical therapy. 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  The patient used to have a lot of disruptive agitated behaviors but he is doing much better now on a combination of intuitive and Focalin he's not had any significant side effects. He does have an IEP at school and his grades are usually C's and D's  The patient returns for followup after 3 months.  He is now going to a remedial program  at [employer]. Eventually he will start college courses. He is not yet driving and his mother takes him to school. He has been behaving well at home. He comes in alone and does not relate any problems with temper or getting along with others. He states that he is enjoying college but is starting to get harder now     Suicidal Ideation: No Plan Formed: No Patient has means to carry out plan: No  Homicidal Ideation: No Plan Formed: No Patient has means to carry out plan: No  Review of Systems: Psychiatric: Agitation: No Hallucination: No Depressed Mood: No Insomnia: No Hypersomnia: No Altered Concentration: No Feels Worthless: No Grandiose Ideas: No Belief In Special Powers: No New/Increased Substance Abuse: No Compulsions: No  Neurologic: Headache: No Seizure: No Paresthesias: No  Past Medical Family, Social History: He is attending a remedial program at [employer] Allergies: Allergies  Allergen Reactions  . Abilify [Aripiprazole] Other (See Comments)    Altered mental state  . Amoxicillin-Pot Clavulanate Rash   Medical History: Past Medical History  Diagnosis Date  . ADHD (attention deficit hyperactivity disorder)   . Oppositional defiant disorder   . Premature birth   . Bronchitis   . Chromosomal abnormality   . Sleep apnoea, obstructed   . Seasonal allergies   . Diabetes  mellitus type II   . Asthma     seasonal related   Surgical History: Past Surgical History  Procedure Laterality Date  . Tubes in ears during early childhood    . Tonsillectomy and adenoidectomy     Family History: family history includes Alcohol abuse in his father; Anxiety disorder in his maternal grandmother; Autism spectrum disorder in his cousin; Drug abuse in his father; Seizures in his paternal uncle. There is no history of ADD / ADHD, Bipolar disorder, Dementia, Depression, OCD, Paranoid behavior, Schizophrenia, Physical abuse, or Sexual  abuse. Reviewed and nothing new today.  Outpatient Encounter Prescriptions as of 01/12/2016  Medication Sig  . Dexmethylphenidate HCl 35 MG CP24 Take 35 mg by mouth every morning.  Marland Kitchen guaiFENesin (DIABETIC TUSSIN EX) 100 MG/5ML liquid Take 200 mg by mouth once as needed. For cough/cold  . guanFACINE (INTUNIV) 4 MG TB24 SR tablet Take 1 tablet (4 mg total) by mouth every morning.  . metFORMIN (GLUCOPHAGE-XR) 750 MG 24 hr tablet Take 1,500 mg by mouth daily with breakfast.  . tretinoin (RETIN-A) 0.025 % cream Apply 1 application topically daily as needed. For skin breakouts  . [DISCONTINUED] Dexmethylphenidate HCl 35 MG CP24 Take 35 mg by mouth every morning.  . [DISCONTINUED] guanFACINE (INTUNIV) 4 MG TB24 SR tablet Take 1 tablet (4 mg total) by mouth every morning.  . cetirizine (ZYRTEC) 10 MG tablet Take 10 mg by mouth daily.  Marland Kitchen Dexmethylphenidate HCl 35 MG CP24 Take 35 mg by mouth every morning.  Marland Kitchen Dexmethylphenidate HCl 35 MG CP24 Take one each am  . [DISCONTINUED] Dexmethylphenidate HCl 35 MG CP24 Take 35 mg by mouth every morning. (Patient not taking: Reported on 01/12/2016)  . [DISCONTINUED] Dexmethylphenidate HCl 35 MG CP24 Take one each am (Patient not taking: Reported on 01/12/2016)   No facility-administered encounter medications on file as of 01/12/2016.    Past Psychiatric History/Hospitalization(s): Anxiety: No Bipolar Disorder: Yes Depression: No Mania: No Psychosis: No Schizophrenia: No Personality Disorder: No Hospitalization for psychiatric illness: No History of Electroconvulsive Shock Therapy: No Prior Suicide Attempts: No  Physical Exam: Constitutional:  BP 105/61 mmHg  Pulse 80  Ht 5' 6.53" (1.69 m)  Wt 209 lb 12.8 oz (95.165 kg)  BMI 33.32 kg/m2  SpO2 94%  General Appearance: alert, oriented, no acute distress and obese  Musculoskeletal: Strength & Muscle Tone: within normal limits Gait & Station: normal Patient leans: N/A  Psychiatric: Speech  (describe rate, volume, coherence, spontaneity, and abnormalities if any): Normal in volume, rate, tone, spontaneous   Thought Process (describe rate, content, abstract reasoning, and computation): Organized, goal directed, age appropriate   Associations: Intact  Thoughts: normal  Mental Status: Orientation: oriented to person, place and situation Mood & Affect: normal affect Attention Span & Concentration: OK  Lab Results:  No results found for this or any previous visit (from the past 8736 hour(s)).Labs are good.  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  I reviewed CC, tobacco/med/surg Hx, meds effects/ side effects, problem list, therapies and responses as well as current situation/symptoms discussed options. He'll continue Focalin and Intuniv for ADHD symptoms and return in 3 months See orders and pt instructions for more details.  MEDICATIONS this encounter: Meds ordered this encounter  Medications  .  guanFACINE (INTUNIV) 4 MG TB24 SR tablet    Sig: Take 1 tablet (4 mg total) by mouth every morning.    Dispense:  30 tablet    Refill:  2  . Dexmethylphenidate HCl 35 MG CP24    Sig: Take 35 mg by mouth every morning.    Dispense:  30 capsule    Refill:  0    Do not fill before 03/11/16  . Dexmethylphenidate HCl 35 MG CP24    Sig: Take one each am    Dispense:  30 capsule    Refill:  0    Do not fill before 02/12/16  . Dexmethylphenidate HCl 35 MG CP24    Sig: Take 35 mg by mouth every morning.    Dispense:  30 capsule    Refill:  0    Medical Decision Making Problem Points:  Established problem, worsening (2), Review of last therapy session (1) and Review of psycho-social stressors (1) Data Points:   Review or order clinical lab tests (1) Review of medication regiment & side effects (2) Review of new medications or change in dosage (2)  I certify that outpatient services furnished can reasonably be expected to improve the patient's condition.   [name], MD

## 2016-03-31 ENCOUNTER — Telehealth (HOSPITAL_COMMUNITY): Payer: Self-pay | Admitting: *Deleted

## 2016-03-31 ENCOUNTER — Other Ambulatory Visit (HOSPITAL_COMMUNITY): Payer: Self-pay | Admitting: Psychiatry

## 2016-03-31 DIAGNOSIS — F913 Oppositional defiant disorder: Secondary | ICD-10-CM

## 2016-03-31 DIAGNOSIS — F902 Attention-deficit hyperactivity disorder, combined type: Secondary | ICD-10-CM

## 2016-03-31 MED ORDER — DEXMETHYLPHENIDATE HCL ER 35 MG PO CP24: 35.0000 mg | ORAL_CAPSULE | ORAL | Status: DC

## 2016-03-31 MED ORDER — GUANFACINE HCL ER 4 MG PO TB24: 4.0000 mg | ORAL_TABLET | ORAL | Status: DC

## 2016-03-31 NOTE — Telephone Encounter (Signed)
Sent and printed

## 2016-03-31 NOTE — Telephone Encounter (Signed)
Pt was schedule for f/u on 04-11-16 but had to be rescheduled due to provider out of office. Per pt chart, pt Dexmethylphenidate 35 mg and GuanFacine 4 mg both were filled 01-12-16 with 2 refills. Pt f/u appt is scheduled for 04-05-16. Pt number is [phone].

## 2016-04-04 NOTE — Telephone Encounter (Signed)
Called pt and he agreed to pick up scripts when he comes for his f/u appt.

## 2016-04-05 ENCOUNTER — Encounter (HOSPITAL_COMMUNITY): Payer: Self-pay | Admitting: Psychiatry

## 2016-04-05 ENCOUNTER — Ambulatory Visit (INDEPENDENT_AMBULATORY_CARE_PROVIDER_SITE_OTHER): Payer: Medicaid Other | Admitting: Psychiatry

## 2016-04-05 VITALS — BP 151/73 | HR 78 | Ht 66.56 in | Wt 213.0 lb

## 2016-04-05 DIAGNOSIS — F913 Oppositional defiant disorder: Secondary | ICD-10-CM

## 2016-04-05 DIAGNOSIS — F902 Attention-deficit hyperactivity disorder, combined type: Secondary | ICD-10-CM | POA: Diagnosis not present

## 2016-04-05 DIAGNOSIS — F39 Unspecified mood [affective] disorder: Secondary | ICD-10-CM | POA: Diagnosis not present

## 2016-04-05 MED ORDER — DEXMETHYLPHENIDATE HCL ER 35 MG PO CP24: 35.0000 mg | ORAL_CAPSULE | ORAL | Status: DC

## 2016-04-05 MED ORDER — GUANFACINE HCL ER 4 MG PO TB24: 4.0000 mg | ORAL_TABLET | ORAL | Status: DC

## 2016-04-05 MED ORDER — DEXMETHYLPHENIDATE HCL ER 35 MG PO CP24: ORAL_CAPSULE | ORAL | Status: DC

## 2016-04-05 NOTE — Progress Notes (Signed)
Patient ID: Aaron Dean Levitt, male   DOB: 08/30/1996, 20 y.o.   MRN: [id number] Patient ID: Aaron Dean Dandy, male   DOB: 08/30/1996, 20 y.o.   MRN: [id number] Patient ID: Aaron Dean Lallier, male   DOB: 08/30/1996, 20 y.o.   MRN: [id number] Patient ID: Aaron Dean Houchins, male   DOB: 08/30/1996, 20 y.o.   MRN: [id number] Patient ID: Aaron Dean Rini, male   DOB: 08/30/1996, 20 y.o.   MRN: [id number] Patient ID: Aaron Dean Wach, male   DOB: 08/30/1996, 20 y.o.   MRN: [id number] Patient ID: Aaron Dean Ridlon, male   DOB: 08/30/1996, 20 y.o.   MRN: [id number] Patient ID: Aaron Dean Rufer, male   DOB: 08/30/1996, 20 y.o.   MRN: [id number] Patient ID: Aaron Dean Nydam, male   DOB: 08/30/1996, 20 y.o.   MRN: [id number] Patient ID: Aaron Dean Sossamon, male   DOB: 08/30/1996, 20 y.o.   MRN: [id number] Patient ID: Aaron Dean Clowers, male   DOB: 08/30/1996, 20 y.o.   MRN: [id number] Patient ID: Aaron Dean Ragain, male   DOB: 08/30/1996, 20 y.o.   MRN: [id number] [facility] [id number] Progress Note Aaron Dean Guastella MRN: [id number] DOB: 08/30/1996 Age: 20 y.o.  Date: 04/05/2016 Start Time: 3:30 PM End Time: 3:55 PM  Chief Complaint: Chief Complaint  Patient presents with  . ADHD  . Follow-up   Subjective: "I am doing okay"  This patient is a 20 year-old black male who lives with his mother grandmother and 2 uncles in [place]. He is taking classes at [employer]  Apparently the patient was born at 28 weeks due to placenta disruption. He was in the NICU for 4 months. He had delayed milestones and needed speech and physical therapy. 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  The patient used to have a lot of disruptive agitated behaviors but he is doing much better now on a combination of intuitive and Focalin he's not had any significant side effects. He does have an IEP at school and his grades are usually C's and D's  The  patient returns for followup after 3 months.  He is now going to a remedial program at [employer]. Eventually he will start college courses. He is not yet driving and his mother takes him to school. He has been behaving well at home. He comes in alone and does not relate any problems with temper or getting along with others. He states that he failed [place] and math in the first semester and has to take them over the summer session and next fall. He realizes he was not organized and didn't turn in work on time. He has a lot of difficulty also with getting his thoughts expressed on paper. We talked about improving his organizational skills and also using the learning lab to get help. He still thinks his medications have been helpful for focus     Suicidal Ideation: No Plan Formed: No Patient has means to carry out plan: No  Homicidal Ideation: No Plan Formed: No Patient has means to carry out plan: No  Review of Systems: Psychiatric: Agitation: No Hallucination: No Depressed Mood: No Insomnia: No Hypersomnia: No Altered Concentration: yes Feels Worthless: No Grandiose Ideas: No Belief In Special Powers: No New/Increased Substance Abuse: No Compulsions: No  Neurologic: Headache: No Seizure: No Paresthesias: No  Past Medical Family, Social History: He is attending a remedial program  at [employer] Allergies: Allergies  Allergen Reactions  . Abilify [Aripiprazole] Other (See Comments)    Altered mental state  . Amoxicillin-Pot Clavulanate Rash   Medical History: Past Medical History  Diagnosis Date  . ADHD (attention deficit hyperactivity disorder)   . Oppositional defiant disorder   . Premature birth   . Bronchitis   . Chromosomal abnormality   . Sleep apnoea, obstructed   . Seasonal allergies   . Diabetes mellitus type II   . Asthma     seasonal related   Surgical History: Past Surgical History  Procedure Laterality Date  . Tubes  in ears during early childhood    . Tonsillectomy and adenoidectomy     Family History: family history includes Alcohol abuse in his father; Anxiety disorder in his maternal grandmother; Autism spectrum disorder in his cousin; Drug abuse in his father; Seizures in his paternal uncle. There is no history of ADD / ADHD, Bipolar disorder, Dementia, Depression, OCD, Paranoid behavior, Schizophrenia, Physical abuse, or Sexual abuse. Reviewed and nothing new today.  Outpatient Encounter Prescriptions as of 04/05/2016  Medication Sig  . cetirizine (ZYRTEC) 10 MG tablet Take 10 mg by mouth daily.  Marland Kitchen Dexmethylphenidate HCl 35 MG CP24 Take one each am  . guaiFENesin (DIABETIC TUSSIN EX) 100 MG/5ML liquid Take 200 mg by mouth once as needed. For cough/cold  . guanFACINE (INTUNIV) 4 MG TB24 SR tablet Take 1 tablet (4 mg total) by mouth every morning.  . metFORMIN (GLUCOPHAGE-XR) 750 MG 24 hr tablet Take 1,500 mg by mouth daily with breakfast.  . tretinoin (RETIN-A) 0.025 % cream Apply 1 application topically daily as needed. For skin breakouts  . [DISCONTINUED] Dexmethylphenidate HCl 35 MG CP24 Take one each am  . [DISCONTINUED] guanFACINE (INTUNIV) 4 MG TB24 SR tablet Take 1 tablet (4 mg total) by mouth every morning.  Marland Kitchen Dexmethylphenidate HCl 35 MG CP24 Take 35 mg by mouth every morning.  Marland Kitchen Dexmethylphenidate HCl 35 MG CP24 Take 35 mg by mouth every morning.  . [DISCONTINUED] Dexmethylphenidate HCl 35 MG CP24 Take 35 mg by mouth every morning. (Patient not taking: Reported on 04/05/2016)  . [DISCONTINUED] Dexmethylphenidate HCl 35 MG CP24 Take 35 mg by mouth every morning. (Patient not taking: Reported on 04/05/2016)   No facility-administered encounter medications on file as of 04/05/2016.    Past Psychiatric History/Hospitalization(s): Anxiety: No Bipolar Disorder: Yes Depression: No Mania: No Psychosis: No Schizophrenia: No Personality Disorder: No Hospitalization for psychiatric illness:  No History of Electroconvulsive Shock Therapy: No Prior Suicide Attempts: No  Physical Exam: Constitutional:  BP 151/73 mmHg  Pulse 78  Ht 5' 6.56" (1.691 m)  Wt 213 lb (96.616 kg)  BMI 33.79 kg/m2  SpO2 95%  General Appearance: alert, oriented, no acute distress and obese  Musculoskeletal: Strength & Muscle Tone: within normal limits Gait & Station: normal Patient leans: N/A  Psychiatric: Speech (describe rate, volume, coherence, spontaneity, and abnormalities if any): Normal in volume, rate, tone, spontaneous   Thought Process (describe rate, content, abstract reasoning, and computation): Organized, goal directed, age appropriate   Associations: Intact  Thoughts: normal  Mental Status: Orientation: oriented to person, place and situation Mood & Affect: normal affect Attention Span & Concentration: Fair, improved with medications  Lab Results:  No results found for this or any previous visit (from the past 8736 hour(s)).Labs are good.  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  I reviewed CC, tobacco/med/surg Hx, meds effects/ side effects, problem list, therapies and responses as well as current situation/symptoms discussed options. He'll continue Focalin and Intuniv for ADHD symptoms and return in 3 months See orders and pt instructions for more details.  MEDICATIONS this encounter: Meds ordered this encounter  Medications  . guanFACINE (INTUNIV) 4 MG TB24 SR tablet    Sig: Take 1 tablet (4 mg total) by mouth every morning.    Dispense:  30 tablet    Refill:  2  . Dexmethylphenidate HCl 35 MG CP24    Sig: Take 35 mg by mouth every morning.    Dispense:  30 capsule    Refill:  0  .  Dexmethylphenidate HCl 35 MG CP24    Sig: Take one each am    Dispense:  30 capsule    Refill:  0    Do not fill before 05/04/16  . Dexmethylphenidate HCl 35 MG CP24    Sig: Take 35 mg by mouth every morning.    Dispense:  30 capsule    Refill:  0    Do not fill before 06/04/16    Medical Decision Making Problem Points:  Established problem, worsening (2), Review of last therapy session (1) and Review of psycho-social stressors (1) Data Points:  Review or order clinical lab tests (1) Review of medication regiment & side effects (2) Review of new medications or change in dosage (2)  I certify that outpatient services furnished can reasonably be expected to improve the patient's condition.   [name], MD

## 2016-04-11 ENCOUNTER — Ambulatory Visit (HOSPITAL_COMMUNITY): Payer: Self-pay | Admitting: Psychiatry

## 2016-05-10 ENCOUNTER — Telehealth (HOSPITAL_COMMUNITY): Payer: Self-pay | Admitting: *Deleted

## 2016-05-10 NOTE — Telephone Encounter (Signed)
phone call from patient, said he need documentation proving he has a disability.  this is for his financial aid.  He needs it by May 24th.

## 2016-05-10 NOTE — Telephone Encounter (Signed)
He will need to bring in the form to see what is required

## 2016-05-12 NOTE — Telephone Encounter (Signed)
Pt came into office and dropped off forms that were requested by provider. Per pt, his school need these forms to be completed.

## 2016-05-12 NOTE — Telephone Encounter (Signed)
Called pt and informed him off what provider stated and he will bring forms to office. Pt showed understanding.

## 2016-05-12 NOTE — Telephone Encounter (Signed)
Called pt and lmtcb to sch f/u to discuss forms dropped off. Number provided on mobile number on file.

## 2016-05-12 NOTE — Telephone Encounter (Signed)
noted 

## 2016-05-15 NOTE — Telephone Encounter (Signed)
Spoke with pt and he made appt for 05-16-16

## 2016-05-16 ENCOUNTER — Encounter (HOSPITAL_COMMUNITY): Payer: Self-pay | Admitting: Psychiatry

## 2016-05-16 ENCOUNTER — Ambulatory Visit (INDEPENDENT_AMBULATORY_CARE_PROVIDER_SITE_OTHER): Payer: Medicaid Other | Admitting: Psychiatry

## 2016-05-16 VITALS — BP 124/75 | HR 83 | Ht 66.57 in | Wt 214.0 lb

## 2016-05-16 DIAGNOSIS — F902 Attention-deficit hyperactivity disorder, combined type: Secondary | ICD-10-CM

## 2016-05-16 NOTE — Progress Notes (Signed)
Patient ID: Aaron Dean Trier, male   DOB: 1996/10/01, 20 y.o.   MRN: [id number] Patient ID: Aaron Dean Riebel, male   DOB: 1996/10/01, 20 y.o.   MRN: [id number] Patient ID: Aaron Dean Pettinato, male   DOB: 1996/10/01, 20 y.o.   MRN: [id number] Patient ID: Aaron Dean Vicens, male   DOB: 1996/10/01, 20 y.o.   MRN: [id number] Patient ID: Aaron Dean Worrall, male   DOB: 1996/10/01, 20 y.o.   MRN: [id number] Patient ID: Aaron Dean Hannay, male   DOB: 1996/10/01, 20 y.o.   MRN: [id number] Patient ID: Aaron Dean Gardin, male   DOB: 1996/10/01, 20 y.o.   MRN: [id number] Patient ID: Aaron Dean Rumble, male   DOB: 1996/10/01, 20 y.o.   MRN: [id number] Patient ID: Aaron Dean Maenza, male   DOB: 1996/10/01, 20 y.o.   MRN: [id number] Patient ID: Aaron Dean Levins, male   DOB: 1996/10/01, 20 y.o.   MRN: [id number] Patient ID: Aaron Dean Lukas, male   DOB: 1996/10/01, 20 y.o.   MRN: [id number] Patient ID: Aaron Dean Maxham, male   DOB: 1996/10/01, 20 y.o.   MRN: [id number] Patient ID: Aaron Dean Shea, male   DOB: 1996/10/01, 20 y.o.   MRN: [id number] [facility] [id number] Progress Note Aaron Dean Dauenhauer MRN: [id number] DOB: 1996/10/01 Age: 20 y.o.  Date: 05/16/2016 Start Time: 3:30 PM End Time: 3:55 PM  Chief Complaint: Chief Complaint  Patient presents with  . ADHD  . Follow-up   Subjective: "I Failed my classes except for basketball."  This patient is a 20 year-old black male who lives with his mother grandmother and 2 uncles in [place]. He is taking [facility] at [employer]  Apparently the patient was born at 28 weeks due to placenta disruption. He was in the NICU for 4 months. He had delayed milestones and needed speech and physical therapy. 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  The patient used to have a lot of disruptive agitated behaviors but he is doing much better now on a combination of intuitive and Focalin he's not had  any significant side effects. He does have an IEP at school and his grades are usually C's and D's  The patient returns for followup after 4 weeks. He came in as a work in today because he has forms from [employer] stating that he needs documentation explaining why he is failing his course work. He cannot get any more financially without documentation. The statement he wrote was very poorly written and ungrammatical. He states that his grandmother died last year and this has bothered him. He states that when he gets home from college she can't focus on his work but he has all day while at school to do his work and he has not taken advantage of the tutoring. I asked him to think seriously about whether or not he can really handle college and perhaps he would be better off for now getting a job but he wants to forge ahead and is already signed up for summer school and the fall semester     Suicidal Ideation: No Plan Formed: No Patient has means to carry out plan: No  Homicidal Ideation: No Plan Formed: No Patient has means to carry out plan: No  Review of Systems: Psychiatric: Agitation: No Hallucination: No Depressed Mood: No Insomnia: No Hypersomnia: No Altered Concentration: yes Feels Worthless: No Grandiose Ideas: No Belief In Special  Powers: No New/Increased Substance Abuse: No Compulsions: No  Neurologic: Headache: No Seizure: No Paresthesias: No  Past Medical Family, Social History: He is attending a remedial program at [employer] Allergies: Allergies  Allergen Reactions  . Abilify [Aripiprazole] Other (See Comments)    Altered mental state  . Amoxicillin-Pot Clavulanate Rash   Medical History: Past Medical History  Diagnosis Date  . ADHD (attention deficit hyperactivity disorder)   . Oppositional defiant disorder   . Premature birth   . Bronchitis   . Chromosomal abnormality   . Sleep apnoea, obstructed   . Seasonal allergies    . Diabetes mellitus type II   . Asthma     seasonal related   Surgical History: Past Surgical History  Procedure Laterality Date  . Tubes in ears during early childhood    . Tonsillectomy and adenoidectomy     Family History: family history includes Alcohol abuse in his father; Anxiety disorder in his maternal grandmother; Autism spectrum disorder in his cousin; Drug abuse in his father; Seizures in his paternal uncle. There is no history of ADD / ADHD, Bipolar disorder, Dementia, Depression, OCD, Paranoid behavior, Schizophrenia, Physical abuse, or Sexual abuse. Reviewed and nothing new today.  Outpatient Encounter Prescriptions as of 05/16/2016  Medication Sig  . cetirizine (ZYRTEC) 10 MG tablet Take 10 mg by mouth daily.  Marland Kitchen Dexmethylphenidate HCl 35 MG CP24 Take 35 mg by mouth every morning.  Marland Kitchen Dexmethylphenidate HCl 35 MG CP24 Take one each am  . Dexmethylphenidate HCl 35 MG CP24 Take 35 mg by mouth every morning.  Marland Kitchen guaiFENesin (DIABETIC TUSSIN EX) 100 MG/5ML liquid Take 200 mg by mouth once as needed. For cough/cold  . guanFACINE (INTUNIV) 4 MG TB24 SR tablet Take 1 tablet (4 mg total) by mouth every morning.  . metFORMIN (GLUCOPHAGE-XR) 750 MG 24 hr tablet Take 1,500 mg by mouth daily with breakfast.  . tretinoin (RETIN-A) 0.025 % cream Apply 1 application topically daily as needed. For skin breakouts   No facility-administered encounter medications on file as of 05/16/2016.    Past Psychiatric History/Hospitalization(s): Anxiety: No Bipolar Disorder: Yes Depression: No Mania: No Psychosis: No Schizophrenia: No Personality Disorder: No Hospitalization for psychiatric illness: No History of Electroconvulsive Shock Therapy: No Prior Suicide Attempts: No  Physical Exam: Constitutional:  BP 124/75 mmHg  Pulse 83  Ht 5' 6.57" (1.691 m)  Wt 214 lb (97.07 kg)  BMI 33.95 kg/m2  SpO2 95%  General Appearance: alert, oriented, no acute distress and  obese  Musculoskeletal: Strength & Muscle Tone: within normal limits Gait & Station: normal Patient leans: N/A  Psychiatric: Speech (describe rate, volume, coherence, spontaneity, and abnormalities if any): Normal in volume, rate, tone, spontaneous   Thought Process (describe rate, content, abstract reasoning, and computation): Organized, goal directed, age appropriate   Associations: Intact  Thoughts: normal  Mental Status: Orientation: oriented to person, place and situation Mood & Affect: normal affect Attention Span & Concentration: Fair, improved with medications  Lab Results:  No results found for this or any previous visit (from the past 8736 hour(s)).Labs are good.  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  I reviewed CC, tobacco/med/surg Hx, meds effects/ side effects, problem list, therapies and responses as well as current  situation/symptoms discussed options. He'll continue Focalin and Intuniv for ADHD symptoms and return in 4 weeks. I will write a letter to the community college explaining his disabilities and limitations See orders and pt instructions for more details.  MEDICATIONS this encounter: No orders of the defined types were placed in this encounter.    Medical Decision Making Problem Points:  Established problem, worsening (2), Review of last therapy session (1) and Review of psycho-social stressors (1) Data Points:  Review or order clinical lab tests (1) Review of medication regiment & side effects (2) Review of new medications or change in dosage (2)  I certify that outpatient services furnished can reasonably be expected to improve the patient's condition.   [name], MD

## 2016-05-17 ENCOUNTER — Telehealth (HOSPITAL_COMMUNITY): Payer: Self-pay | Admitting: *Deleted

## 2016-05-17 NOTE — Telephone Encounter (Signed)
Called pt due to provider writing a letter for pt due to his request. Office noticed pt did not have a release for office to fax letter to [employer]. Called pt to ask him if he wanted office to fax letter to requesting location and pt stated yes. Informed pt that office will need a release and pt agreed to come to office and fill out form. Pt came into office to pick up letter and pt changed his mind about release and stated he did not want to sign release for office to fax letter and he was going to take the letter provider wrote on his behave and pt took letter.

## 2016-05-17 NOTE — Telephone Encounter (Signed)
noted 

## 2016-05-24 ENCOUNTER — Telehealth (HOSPITAL_COMMUNITY): Payer: Self-pay | Admitting: *Deleted

## 2016-05-29 ENCOUNTER — Telehealth (HOSPITAL_COMMUNITY): Payer: Self-pay | Admitting: *Deleted

## 2016-05-29 ENCOUNTER — Other Ambulatory Visit (HOSPITAL_COMMUNITY): Payer: Self-pay | Admitting: Psychiatry

## 2016-05-29 DIAGNOSIS — F902 Attention-deficit hyperactivity disorder, combined type: Secondary | ICD-10-CM

## 2016-05-29 DIAGNOSIS — F913 Oppositional defiant disorder: Secondary | ICD-10-CM

## 2016-05-29 MED ORDER — GUANFACINE HCL ER 4 MG PO TB24: 4.0000 mg | ORAL_TABLET | ORAL | Status: DC

## 2016-05-29 NOTE — Telephone Encounter (Signed)
phone call from patient he need refill of Focalin, he is out.   He also need refill of Intuniv.

## 2016-05-29 NOTE — Telephone Encounter (Signed)
Called number on file and phone kept ringing with no voicemail. Office will call back at another time.

## 2016-05-29 NOTE — Telephone Encounter (Signed)
[place] him. He was given 3 scripts for focalin on 4/12

## 2016-05-30 NOTE — Telephone Encounter (Signed)
Pt mother called office back due to missed phone call on caller id. Informed pt mother of what provider stated. Informed pt mother to call pharmacy to see if they have printed script on hold. Per pt mother, when she called pharmacy they did have pt script on hold.

## 2016-06-14 ENCOUNTER — Encounter (HOSPITAL_COMMUNITY): Payer: Self-pay | Admitting: Psychiatry

## 2016-06-14 ENCOUNTER — Ambulatory Visit (INDEPENDENT_AMBULATORY_CARE_PROVIDER_SITE_OTHER): Payer: Medicaid Other | Admitting: Psychiatry

## 2016-06-14 VITALS — BP 144/73 | HR 77 | Ht 66.58 in | Wt 214.6 lb

## 2016-06-14 DIAGNOSIS — F913 Oppositional defiant disorder: Secondary | ICD-10-CM

## 2016-06-14 DIAGNOSIS — F902 Attention-deficit hyperactivity disorder, combined type: Secondary | ICD-10-CM | POA: Diagnosis not present

## 2016-06-14 MED ORDER — GUANFACINE HCL ER 4 MG PO TB24: 4.0000 mg | ORAL_TABLET | ORAL | Status: DC

## 2016-06-14 MED ORDER — DEXMETHYLPHENIDATE HCL ER 35 MG PO CP24: 35.0000 mg | ORAL_CAPSULE | ORAL | Status: DC

## 2016-06-14 MED ORDER — DEXMETHYLPHENIDATE HCL ER 35 MG PO CP24: ORAL_CAPSULE | ORAL | Status: DC

## 2016-06-14 NOTE — Progress Notes (Signed)
Patient ID: Aaron Dean, male   DOB: 1996-10-30, 20 y.o.   MRN: [id number] Patient ID: Aaron Dean, male   DOB: 1996-10-30, [age] y.o.   MRN: [id number] Patient ID: Aaron Dean, male   DOB: 1996-10-30, 20 y.o.   MRN: [id number] Patient ID: Aaron Dean, male   DOB: 1996-10-30, 20 y.o.   MRN: [id number] Patient ID: Aaron Dean, male   DOB: 1996-10-30, 20 y.o.   MRN: [id number] Patient ID: Aaron Dean, male   DOB: 1996-10-30, 20 y.o.   MRN: [id number] Patient ID: Aaron Dean, male   DOB: 1996-10-30, 20 y.o.   MRN: [id number] Patient ID: Aaron Dean, male   DOB: 1996-10-30, 20 y.o.   MRN: [id number] Patient ID: Aaron Dean, male   DOB: 1996-10-30, 20 y.o.   MRN: [id number] Patient ID: Aaron Dean, male   DOB: 1996-10-30, 20 y.o.   MRN: [id number] Patient ID: Aaron Dean, male   DOB: 1996-10-30, 20 y.o.   MRN: [id number] Patient ID: Aaron Dean, male   DOB: 1996-10-30, 20 y.o.   MRN: [id number] Patient ID: Aaron Dean, male   DOB: 1996-10-30, 20 y.o.   MRN: [id number] Patient ID: Aaron Dean, male   DOB: 1996-10-30, 20 y.o.   MRN: [id number] [facility] [id number] Progress Note Aaron Dean Aaron Dean MRN: [id number] DOB: 1996-10-30 Age: [age] y.o.  Date: 06/14/2016 Start Time: 3:30 PM End Time: 3:55 PM  Chief Complaint: Chief Complaint  Patient presents with  . ADHD  . Follow-up   Subjective: I am enjoying the English class  This patient is a 20 year-old black male who lives with his mother grandmother and 2 uncles [place]. He is taking classes at [employer]  Apparently the patient was born at 28 weeks due to placenta disruption. He was in the NICU for 4 months. He had delayed milestones and needed speech and physical therapy. 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  The patient used to have a lot of disruptive agitated behaviors but he is doing much  better now on a combination of intuitive and Focalin he's not had any significant side effects. He does have an IEP at school and his grades are usually C's and D's  The patient returns for followup after 6 weeks. He didn't really do that well in community college this first year but he is taking a remedial English class this summer. He's really enjoying it and because the classes small he is able to get more individualized help. He states that he is getting a high B. he states that his medicine is still helpful for him and he's not had any difficulties with mood or disruptive behaviors.     Suicidal Ideation: No Plan Formed: No Patient has means to carry out plan: No  Homicidal Ideation: No Plan Formed: No Patient has means to carry out plan: No  Review of Systems: Psychiatric: Agitation: No Hallucination: No Depressed Mood: No Insomnia: No Hypersomnia: No Altered Concentration: yes Feels Worthless: No Grandiose Ideas: No Belief In Special Powers: No New/Increased Substance Abuse: No Compulsions: No  Neurologic: Headache: No Seizure: No Paresthesias: No  Past Medical Family, Social History: He is attending a remedial program at [employer] Allergies: Allergies  Allergen Reactions  . Abilify [Aripiprazole] Other (See Comments)    Altered mental state  . Amoxicillin-Pot Clavulanate Rash  Medical History: Past Medical History  Diagnosis Date  . ADHD (attention deficit hyperactivity disorder)   . Oppositional defiant disorder   . Premature birth   . Bronchitis   . Chromosomal abnormality   . Sleep apnoea, obstructed   . Seasonal allergies   . Diabetes mellitus type II   . Asthma     seasonal related   Surgical History: Past Surgical History  Procedure Laterality Date  . Tubes in ears during early childhood    . Tonsillectomy and adenoidectomy     Family History: family history includes Alcohol abuse in his father; Anxiety disorder in his  maternal grandmother; Autism spectrum disorder in his cousin; Drug abuse in his father; Seizures in his paternal uncle. There is no history of ADD / ADHD, Bipolar disorder, Dementia, Depression, OCD, Paranoid behavior, Schizophrenia, Physical abuse, or Sexual abuse. Reviewed and nothing new today.  Outpatient Encounter Prescriptions as of 06/14/2016  Medication Sig  . cetirizine (ZYRTEC) 10 MG tablet Take 10 mg by mouth daily.  Marland Kitchen Dexmethylphenidate HCl 35 MG CP24 Take 35 mg by mouth every morning.  Marland Kitchen guaiFENesin (DIABETIC TUSSIN EX) 100 MG/5ML liquid Take 200 mg by mouth once as needed. For cough/cold  . guanFACINE (INTUNIV) 4 MG TB24 SR tablet Take 1 tablet (4 mg total) by mouth every morning.  . metFORMIN (GLUCOPHAGE-XR) 750 MG 24 hr tablet Take 1,500 mg by mouth daily with breakfast.  . tretinoin (RETIN-A) 0.025 % cream Apply 1 application topically daily as needed. For skin breakouts  . [DISCONTINUED] Dexmethylphenidate HCl 35 MG CP24 Take 35 mg by mouth every morning.  . [DISCONTINUED] guanFACINE (INTUNIV) 4 MG TB24 SR tablet Take 1 tablet (4 mg total) by mouth every morning.  Marland Kitchen Dexmethylphenidate HCl 35 MG CP24 Take 35 mg by mouth every morning.  Marland Kitchen Dexmethylphenidate HCl 35 MG CP24 Take one each am  . [DISCONTINUED] Dexmethylphenidate HCl 35 MG CP24 Take one each am (Patient not taking: Reported on 06/14/2016)  . [DISCONTINUED] Dexmethylphenidate HCl 35 MG CP24 Take 35 mg by mouth every morning. (Patient not taking: Reported on 06/14/2016)   No facility-administered encounter medications on file as of 06/14/2016.    Past Psychiatric History/Hospitalization(s): Anxiety: No Bipolar Disorder: Yes Depression: No Mania: No Psychosis: No Schizophrenia: No Personality Disorder: No Hospitalization for psychiatric illness: No History of Electroconvulsive Shock Therapy: No Prior Suicide Attempts: No  Physical Exam: Constitutional:  BP 144/73 mmHg  Pulse 77  Ht 5' 6.58" (1.691 m)  Wt 214  lb 9.6 oz (97.342 kg)  BMI 34.04 kg/m2  SpO2 99%  General Appearance: alert, oriented, no acute distress and obese  Musculoskeletal: Strength & Muscle Tone: within normal limits Gait & Station: normal Patient leans: N/A  Psychiatric: Speech (describe rate, volume, coherence, spontaneity, and abnormalities if any): Normal in volume, rate, tone, spontaneous   Thought Process (describe rate, content, abstract reasoning, and computation): Organized, goal directed, age appropriate   Associations: Intact  Thoughts: normal  Mental Status: Orientation: oriented to person, place and situation Mood & Affect: normal affect Attention Span & Concentration: Fair, improved with medications  Lab Results:  No results found for this or any previous visit (from the past 8736 hour(s)).Labs are good.  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  I reviewed CC, tobacco/med/surg Hx, meds effects/ side effects, problem list, therapies and responses as well as current situation/symptoms discussed options. He'll continue Focalin and Intuniv for ADHD symptoms and return in 3 months See orders and pt instructions for more details.  MEDICATIONS this encounter: Meds ordered this encounter  Medications  . guanFACINE (INTUNIV) 4 MG TB24 SR tablet    Sig: Take 1 tablet (4 mg total) by mouth every morning.    Dispense:  30 tablet    Refill:  2  . Dexmethylphenidate HCl 35 MG CP24    Sig: Take 35 mg by mouth every morning.    Dispense:  30 capsule    Refill:  0    Do not fill before 08/14/16  . Dexmethylphenidate HCl 35 MG CP24    Sig: Take one each am    Dispense:  30 capsule    Refill:  0    Do not fill before 07/14/16  .  Dexmethylphenidate HCl 35 MG CP24    Sig: Take 35 mg by mouth every morning.    Dispense:  30 capsule    Refill:  0    Medical Decision Making Problem Points:  Established problem, worsening (2), Review of last therapy session (1) and Review of psycho-social stressors (1) Data Points:  Review or order clinical lab tests (1) Review of medication regiment & side effects (2) Review of new medications or change in dosage (2)  I certify that outpatient services furnished can reasonably be expected to improve the patient's condition.   [name], MD

## 2016-07-05 ENCOUNTER — Ambulatory Visit (HOSPITAL_COMMUNITY): Payer: Self-pay | Admitting: Psychiatry

## 2016-09-14 ENCOUNTER — Ambulatory Visit (INDEPENDENT_AMBULATORY_CARE_PROVIDER_SITE_OTHER): Payer: Medicaid Other | Admitting: Psychiatry

## 2016-09-14 ENCOUNTER — Encounter (HOSPITAL_COMMUNITY): Payer: Self-pay | Admitting: Psychiatry

## 2016-09-14 DIAGNOSIS — F913 Oppositional defiant disorder: Secondary | ICD-10-CM

## 2016-09-14 DIAGNOSIS — F39 Unspecified mood [affective] disorder: Secondary | ICD-10-CM | POA: Diagnosis not present

## 2016-09-14 DIAGNOSIS — F902 Attention-deficit hyperactivity disorder, combined type: Secondary | ICD-10-CM

## 2016-09-14 MED ORDER — DEXMETHYLPHENIDATE HCL ER 35 MG PO CP24: ORAL_CAPSULE | ORAL | 0 refills | Status: DC

## 2016-09-14 MED ORDER — DEXMETHYLPHENIDATE HCL ER 35 MG PO CP24: 35.0000 mg | ORAL_CAPSULE | ORAL | 0 refills | Status: DC

## 2016-09-14 MED ORDER — GUANFACINE HCL ER 4 MG PO TB24: 4.0000 mg | ORAL_TABLET | ORAL | 2 refills | Status: DC

## 2016-09-14 NOTE — Progress Notes (Signed)
Patient ID: Aaron Dean, male   DOB: Nov 13, 1996, 20 y.o.   MRN: [id number] Patient ID: Aaron Dean, male   DOB: 07/13/1996, 20 y.o.   MRN: [id number] Patient ID: Aaron Dean, male   DOB: [birth year], 20 y.o.   MRN: [id number] Patient ID: Aaron Dean, male   DOB: 04/02/1996, 20 y.o.   MRN: [id number] Patient ID: Aaron Dean, male   DOB: 10-24-1996, 20 y.o.   MRN: [id number] Patient ID: Aaron Dean, male   DOB: [birth year], 20 y.o.   MRN: [id number] Patient ID: Aaron Dean, male   DOB: 02-Nov-1996, 20 y.o.   MRN: [id number] Patient ID: Aaron Dean, male   DOB: 1996/10/04, 20 y.o.   MRN: [id number] Patient ID: Aaron Dean, male   DOB: 1996/08/17, 20 y.o.   MRN: [id number] Patient ID: Aaron Dean, male   DOB: 05-05-1996, 20 y.o.   MRN: [id number] Patient ID: Aaron Dean, male   DOB: 21-Jan-1996, 20 y.o.   MRN: [id number] Patient ID: Aaron Dean, male   DOB: 1996-03-08, 20 y.o.   MRN: [id number] Patient ID: Aaron Dean, male   DOB: Aug 10, 1996, 20 y.o.   MRN: [id number] Patient ID: Aaron Dean, male   DOB: 1996/12/10, 20 y.o.   MRN: [id number] [facility] [address] Progress Note Aaron Dean MRN: [id number] DOB: [birth year] Age: 20 y.o.  Date: 09/14/2016 Start Time: 3:30 PM End Time: 3:55 PM  Chief Complaint: Chief Complaint  Patient presents with  . ADHD  . Follow-up   Subjective: I am enjoying the English class  This patient is a 20 year-old black male who lives with his mother grandmother and 2 uncles in [place]. He is taking classes at [employer]  Apparently the patient was born at 28 weeks due to placenta disruption. He was in the NICU for 4 months. He had delayed milestones and needed speech and physical therapy. 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  The patient used to have a lot of disruptive agitated behaviors but he is doing much  better now on a combination of intuitive and Focalin he's not had any significant side effects. He does have an IEP at school and his grades are usually C's and D's  The patient returns for followup after 3 months. He had to start committee [employer] late this fall because he didn't get his financial aid in time. He is taking a PE class and then will take a math class. He states that he is not getting into any trouble and his behavior is good and he is staying well focused. He is trying to work on his diet and exercise to get his blood sugar improved and to lose weight     Suicidal Ideation: No Plan Formed: No Patient has means to carry out plan: No  Homicidal Ideation: No Plan Formed: No Patient has means to carry out plan: No  Review of Systems: Psychiatric: Agitation: No Hallucination: No Depressed Mood: No Insomnia: No Hypersomnia: No Altered Concentration: yes Feels Worthless: No Grandiose Ideas: No Belief In Special Powers: No New/Increased Substance Abuse: No Compulsions: No  Neurologic: Headache: No Seizure: No Paresthesias: No  Past Medical Family, Social History: He is attending a remedial program at [employer] Allergies: Allergies  Allergen Reactions  . Abilify [Aripiprazole] Other (See Comments)    Altered mental state  . Amoxicillin-Pot Clavulanate  Rash   Medical History: Past Medical History:  Diagnosis Date  . ADHD (attention deficit hyperactivity disorder)   . Asthma    seasonal related  . Bronchitis   . Chromosomal abnormality   . Diabetes mellitus type II   . Oppositional defiant disorder   . Premature birth   . Seasonal allergies   . Sleep apnoea, obstructed    Surgical History: Past Surgical History:  Procedure Laterality Date  . TONSILLECTOMY AND ADENOIDECTOMY    . Tubes in ears during early childhood     Family History: family history includes Alcohol abuse in his father; Anxiety disorder in his maternal grandmother;  Autism spectrum disorder in his cousin; Drug abuse in his father; Seizures in his paternal uncle. Reviewed and nothing new today.  Outpatient Encounter Prescriptions as of 09/14/2016  Medication Sig Dispense Refill  . cetirizine (ZYRTEC) 10 MG tablet Take 10 mg by mouth daily.    Marland Kitchen Dexmethylphenidate HCl 35 MG CP24 Take 35 mg by mouth every morning. 30 capsule 0  . Dexmethylphenidate HCl 35 MG CP24 Take one each am 30 capsule 0  . Dexmethylphenidate HCl 35 MG CP24 Take 35 mg by mouth every morning. 30 capsule 0  . guaiFENesin (DIABETIC TUSSIN EX) 100 MG/5ML liquid Take 200 mg by mouth once as needed. For cough/cold    . guanFACINE (INTUNIV) 4 MG TB24 SR tablet Take 1 tablet (4 mg total) by mouth every morning. 30 tablet 2  . metFORMIN (GLUCOPHAGE-XR) 750 MG 24 hr tablet Take 1,500 mg by mouth daily with breakfast.    . tretinoin (RETIN-A) 0.025 % cream Apply 1 application topically daily as needed. For skin breakouts    . [DISCONTINUED] Dexmethylphenidate HCl 35 MG CP24 Take 35 mg by mouth every morning. 30 capsule 0  . [DISCONTINUED] Dexmethylphenidate HCl 35 MG CP24 Take one each am 30 capsule 0  . [DISCONTINUED] Dexmethylphenidate HCl 35 MG CP24 Take 35 mg by mouth every morning. 30 capsule 0  . [DISCONTINUED] guanFACINE (INTUNIV) 4 MG TB24 SR tablet Take 1 tablet (4 mg total) by mouth every morning. 30 tablet 2   No facility-administered encounter medications on file as of 09/14/2016.     Past Psychiatric History/Hospitalization(s): Anxiety: No Bipolar Disorder: Yes Depression: No Mania: No Psychosis: No Schizophrenia: No Personality Disorder: No Hospitalization for psychiatric illness: No History of Electroconvulsive Shock Therapy: No Prior Suicide Attempts: No  Physical Exam: Constitutional:  BP 133/68 (BP Location: Right Arm, Patient Position: Sitting, Cuff Size: Normal)   Pulse 84   Ht 5\' 6"  (1.676 m)   Wt 212 lb (96.2 kg)   SpO2 97%   BMI 34.22 kg/m   General  Appearance: alert, oriented, no acute distress and obese  Musculoskeletal: Strength & Muscle Tone: within normal limits Gait & Station: normal Patient leans: N/A  Psychiatric: Speech (describe rate, volume, coherence, spontaneity, and abnormalities if any): Normal in volume, rate, tone, spontaneous   Thought Process (describe rate, content, abstract reasoning, and computation): Organized, goal directed, age appropriate   Associations: Intact  Thoughts: normal  Mental Status: Orientation: oriented to person, place and situation Mood & Affect: normal affect Attention Span & Concentration: Fair, improved with medications  Lab Results:  No results found for this or any previous visit (from the past 8736 hour(s)).Labs are good.  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  I reviewed CC, tobacco/med/surg Hx, meds effects/ side effects, problem list, therapies and responses as well as current situation/symptoms discussed options. He'll continue Focalin and Intuniv for ADHD symptoms and return in 3 months See orders and pt instructions for more details.  MEDICATIONS this encounter: Meds ordered this encounter  Medications  . guanFACINE (INTUNIV) 4 MG TB24 SR tablet    Sig: Take 1 tablet (4 mg total) by mouth every morning.    Dispense:  30 tablet    Refill:  2  . Dexmethylphenidate HCl 35 MG CP24    Sig: Take 35 mg by mouth every morning.    Dispense:  30 capsule    Refill:  0    Do not fill before 10/14/16  . Dexmethylphenidate HCl 35 MG CP24    Sig: Take one each am    Dispense:  30 capsule    Refill:  0    Do not fill before 11/14/16  . Dexmethylphenidate HCl 35 MG CP24    Sig: Take 35 mg by mouth every morning.     Dispense:  30 capsule    Refill:  0    Medical Decision Making Problem Points:  Established problem, worsening (2), Review of last therapy session (1) and Review of psycho-social stressors (1) Data Points:  Review or order clinical lab tests (1) Review of medication regiment & side effects (2) Review of new medications or change in dosage (2)  I certify that outpatient services furnished can reasonably be expected to improve the patient's condition.   Diannia RuderOSS, Hadlei Stitt, MD

## 2016-12-12 ENCOUNTER — Encounter (HOSPITAL_COMMUNITY): Payer: Self-pay | Admitting: Psychiatry

## 2016-12-12 ENCOUNTER — Ambulatory Visit (INDEPENDENT_AMBULATORY_CARE_PROVIDER_SITE_OTHER): Payer: Medicaid Other | Admitting: Psychiatry

## 2016-12-12 VITALS — BP 128/74 | HR 93 | Ht 66.0 in | Wt 214.4 lb

## 2016-12-12 DIAGNOSIS — Z811 Family history of alcohol abuse and dependence: Secondary | ICD-10-CM

## 2016-12-12 DIAGNOSIS — F39 Unspecified mood [affective] disorder: Secondary | ICD-10-CM

## 2016-12-12 DIAGNOSIS — Z818 Family history of other mental and behavioral disorders: Secondary | ICD-10-CM

## 2016-12-12 DIAGNOSIS — Z888 Allergy status to other drugs, medicaments and biological substances status: Secondary | ICD-10-CM | POA: Diagnosis not present

## 2016-12-12 DIAGNOSIS — Z813 Family history of other psychoactive substance abuse and dependence: Secondary | ICD-10-CM | POA: Diagnosis not present

## 2016-12-12 DIAGNOSIS — F913 Oppositional defiant disorder: Secondary | ICD-10-CM | POA: Diagnosis not present

## 2016-12-12 DIAGNOSIS — F902 Attention-deficit hyperactivity disorder, combined type: Secondary | ICD-10-CM

## 2016-12-12 MED ORDER — FOCALIN XR 35 MG PO CP24: 35.0000 mg | ORAL_CAPSULE | ORAL | 0 refills | Status: DC

## 2016-12-12 MED ORDER — DEXMETHYLPHENIDATE HCL ER 35 MG PO CP24: ORAL_CAPSULE | ORAL | 0 refills | Status: DC

## 2016-12-12 MED ORDER — DEXMETHYLPHENIDATE HCL ER 35 MG PO CP24: 35.0000 mg | ORAL_CAPSULE | ORAL | 0 refills | Status: DC

## 2016-12-12 MED ORDER — GUANFACINE HCL ER 4 MG PO TB24: 4.0000 mg | ORAL_TABLET | ORAL | 2 refills | Status: DC

## 2016-12-12 NOTE — Progress Notes (Signed)
Patient ID: Aaron Dean, male   DOB: [birth year], 20 y.o.   MRN: [id number] Patient ID: Aaron Dean, male   DOB: [birth year], 20 y.o.   MRN: [id number] Patient ID: Aaron Dean, male   DOB: 11-Oct-1996, 20 y.o.   MRN: [id number] Patient ID: Aaron Dean, male   DOB: 1996/12/03, 20 y.o.   MRN: [id number] Patient ID: Aaron Dean, male   DOB: [birth year], 20 y.o.   MRN: [id number] Patient ID: Aaron Dean, male   DOB: 1996/11/05, 20 y.o.   MRN: [id number] Patient ID: Aaron Dean, male   DOB: [birth year], 20 y.o.   MRN: [id number] Patient ID: Aaron Dean, male   DOB: 07-21-1996, 20 y.o.   MRN: [id number] Patient ID: Aaron Dean, male   DOB: 11-29-1996, 20 y.o.   MRN: [id number] Patient ID: Aaron Dean, male   DOB: Apr 08, 1996, 20 y.o.   MRN: [id number] Patient ID: Aaron Dean, male   DOB: 19-Aug-1996, 20 y.o.   MRN: [id number] Patient ID: Aaron Dean, male   DOB: [birth year], 20 y.o.   MRN: [id number] Patient ID: Aaron Dean, male   DOB: 1996/07/20, 20 y.o.   MRN: [id number] Patient ID: Aaron Dean, male   DOB: 04/11/1996, 20 y.o.   MRN: [id number] [facility] [id number] Progress Note Aaron Dean MRN: [id number] DOB: 12-May-1996 Age: 20 y.o.  Date: 12/12/2016 Start Time: 3:30 PM End Time: 3:55 PM  Chief Complaint: Chief Complaint  Patient presents with  . ADHD  . Follow-up   Subjective: I am enjoying the Dean classes  This patient is a 20 year-old black male who lives with his mother grandmother and 2 uncles in [place]. He is taking classes at [employer]  Apparently the patient was born at 28 weeks due to placenta disruption. He was in the NICU for 4 months. He had delayed milestones and needed speech and physical therapy. 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  The patient used to have a lot of disruptive agitated behaviors but he is doing  much better now on a combination of intuitive and Focalin he's not had any significant side effects. He does have an IEP at school and his grades are usually C's and D's  The patient returns for followup after 3 months. He is still taking community Dean courses and is struggling in math. However he is persistent and wants to keep going with it. We discussed the fact that Medicaid is not going to  pay for the generic Focalin XR and we'll have to get the brand name that he and his mom are okay with this. His mood has been good and he's not had any outbursts  Suicidal Ideation: No Plan Formed: No Patient has means to carry out plan: No  Homicidal Ideation: No Plan Formed: No Patient has means to carry out plan: No  Review of Systems: Psychiatric: Agitation: No Hallucination: No Depressed Mood: No Insomnia: No Hypersomnia: No Altered Concentration: yes Feels Worthless: No Grandiose Ideas: No Belief In Special Powers: No New/Increased Substance Abuse: No Compulsions: No  Neurologic: Headache: No Seizure: No Paresthesias: No  Past Medical Family, Social History: He is attending a remedial program at [employer] Allergies: Allergies  Allergen Reactions  . Abilify [Aripiprazole] Other (See Comments)    Altered mental state  . Amoxicillin-Pot Clavulanate Rash   Medical History: Past Medical  History:  Diagnosis Date  . ADHD (attention deficit hyperactivity disorder)   . Asthma    seasonal related  . Bronchitis   . Chromosomal abnormality   . Diabetes mellitus type II   . Oppositional defiant disorder   . Premature birth   . Seasonal allergies   . Sleep apnoea, obstructed    Surgical History: Past Surgical History:  Procedure Laterality Date  . TONSILLECTOMY AND ADENOIDECTOMY    . Tubes in ears during early childhood     Family History: family history includes Alcohol abuse in his father; Anxiety disorder in his maternal grandmother; Autism spectrum  disorder in his cousin; Drug abuse in his father; Seizures in his paternal uncle. Reviewed and nothing new today.  Outpatient Encounter Prescriptions as of 12/12/2016  Medication Sig Dispense Refill  . cetirizine (ZYRTEC) 10 MG tablet Take 10 mg by mouth daily.    Marland Kitchen. Dexmethylphenidate HCl 35 MG CP24 Take one each am 30 capsule 0  . guaiFENesin (DIABETIC TUSSIN EX) 100 MG/5ML liquid Take 200 mg by mouth once as needed. For cough/cold    . guanFACINE (INTUNIV) 4 MG TB24 SR tablet Take 1 tablet (4 mg total) by mouth every morning. 30 tablet 2  . metFORMIN (GLUCOPHAGE-XR) 750 MG 24 hr tablet Take 1,500 mg by mouth daily with breakfast.    . tretinoin (RETIN-A) 0.025 % cream Apply 1 application topically daily as needed. For skin breakouts    . [DISCONTINUED] Dexmethylphenidate HCl 35 MG CP24 Take one each am 30 capsule 0  . [DISCONTINUED] guanFACINE (INTUNIV) 4 MG TB24 SR tablet Take 1 tablet (4 mg total) by mouth every morning. 30 tablet 2  . Dexmethylphenidate HCl 35 MG CP24 Take 35 mg by mouth every morning. 30 capsule 0  . Dexmethylphenidate HCl 35 MG CP24 Take 35 mg by mouth every morning. 30 capsule 0  . FOCALIN XR 35 MG CP24 Take 35 mg by mouth every morning. 30 capsule 0  . FOCALIN XR 35 MG CP24 Take 35 mg by mouth every morning. 30 capsule 0  . FOCALIN XR 35 MG CP24 Take 35 mg by mouth every morning. 30 capsule 0  . [DISCONTINUED] Dexmethylphenidate HCl 35 MG CP24 Take 35 mg by mouth every morning. (Patient not taking: Reported on 12/12/2016) 30 capsule 0  . [DISCONTINUED] Dexmethylphenidate HCl 35 MG CP24 Take 35 mg by mouth every morning. (Patient not taking: Reported on 12/12/2016) 30 capsule 0   No facility-administered encounter medications on file as of 12/12/2016.     Past Psychiatric History/Hospitalization(s): Anxiety: No Bipolar Disorder: Yes Depression: No Mania: No Psychosis: No Schizophrenia: No Personality Disorder: No Hospitalization for psychiatric illness:  No History of Electroconvulsive Shock Therapy: No Prior Suicide Attempts: No  Physical Exam: Constitutional:  BP 128/74   Pulse 93   Ht 5\' 6"  (1.676 m)   Wt 214 lb 6.4 oz (97.3 kg)   SpO2 94%   BMI 34.61 kg/m   General Appearance: alert, oriented, no acute distress and obese  Musculoskeletal: Strength & Muscle Tone: within normal limits Gait & Station: normal Patient leans: N/A  Psychiatric: Speech (describe rate, volume, coherence, spontaneity, and abnormalities if any): Normal in volume, rate, tone, spontaneous   Thought Process (describe rate, content, abstract reasoning, and computation): Organized, goal directed, age appropriate   Associations: Intact  Thoughts: normal  Mental Status: Orientation: oriented to person, place and situation Mood & Affect: normal affect Attention Span & Concentration: Fair, improved with medications  Lab Results:  No results found for this or any previous visit (from the past 8736 hour(s)).Labs are good.  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  I reviewed CC, tobacco/med/surg Hx, meds effects/ side effects, problem list, therapies and responses as well as current situation/symptoms discussed options. He'll continue Focalin xr-now brand name  and Intuniv for ADHD symptoms and return in 3 months See orders and pt instructions for more details.  MEDICATIONS this encounter:   Medical Decision Making Problem Points:  Established problem, worsening (2), Review of last therapy session (1) and Review of psycho-social stressors (1) Data Points:  Review or order clinical lab tests (1) Review of medication regiment & side effects (2) Review of new medications or change in  dosage (2)  I certify that outpatient services furnished can reasonably be expected to improve the patient's condition.   Diannia RuderOSS, Aaron Copen, MD

## 2017-02-24 ENCOUNTER — Other Ambulatory Visit (HOSPITAL_COMMUNITY): Payer: Self-pay | Admitting: Psychiatry

## 2017-02-24 DIAGNOSIS — F902 Attention-deficit hyperactivity disorder, combined type: Secondary | ICD-10-CM

## 2017-03-12 ENCOUNTER — Ambulatory Visit (HOSPITAL_COMMUNITY): Payer: Self-pay | Admitting: Psychiatry

## 2017-03-14 ENCOUNTER — Ambulatory Visit (INDEPENDENT_AMBULATORY_CARE_PROVIDER_SITE_OTHER): Payer: Medicaid Other | Admitting: Psychiatry

## 2017-03-14 ENCOUNTER — Encounter (HOSPITAL_COMMUNITY): Payer: Self-pay | Admitting: Psychiatry

## 2017-03-14 VITALS — BP 130/78 | Ht 66.0 in | Wt 213.0 lb

## 2017-03-14 DIAGNOSIS — F902 Attention-deficit hyperactivity disorder, combined type: Secondary | ICD-10-CM

## 2017-03-14 DIAGNOSIS — F39 Unspecified mood [affective] disorder: Secondary | ICD-10-CM

## 2017-03-14 DIAGNOSIS — F913 Oppositional defiant disorder: Secondary | ICD-10-CM | POA: Diagnosis not present

## 2017-03-14 MED ORDER — FOCALIN XR 35 MG PO CP24: 35.0000 mg | ORAL_CAPSULE | ORAL | 0 refills | Status: DC

## 2017-03-14 NOTE — Progress Notes (Signed)
Patient ID: Aaron Dean, male   DOB: 06-Jan-1996, 21 y.o.   MRN: [id number] Patient ID: Aaron Dean, male   DOB: 11/01/1996, 21 y.o.   MRN: [id number] Patient ID: Aaron Dean, male   DOB: [birth year], 21 y.o.   MRN: [id number] Patient ID: Aaron Dean, male   DOB: 1996-10-19, 21 y.o.   MRN: [id number] Patient ID: Aaron Dean, male   DOB: [birth year], 21 y.o.   MRN: [id number] Patient ID: Aaron Dean, male   DOB: [birth year], 21 y.o.   MRN: [id number] Patient ID: Aaron Dean, male   DOB: [birth year], 21 y.o.   MRN: [id number] Patient ID: Aaron Dean, male   DOB: 10-09-1996, 21 y.o.   MRN: [id number] Patient ID: Aaron Dean, male   DOB: March 24, 1996, 21 y.o.   MRN: [id number] Patient ID: Aaron Dean, male   DOB: 1996/04/23, 21 y.o.   MRN: [id number] Patient ID: Aaron Dean, male   DOB: 1996-08-02, 21 y.o.   MRN: [id number] Patient ID: Aaron Dean, male   DOB: April 21, 1996, 21 y.o.   MRN: [id number] Patient ID: Aaron Dean, male   DOB: 26-Mar-1996, 21 y.o.   MRN: [id number] Patient ID: Aaron Dean, male   DOB: 09-08-1996, 21 y.o.   MRN: [id number] [facility] [address] Progress Note Aaron Dean MRN: [id number] DOB: [birth year] Age: 21 y.o.  Date: 03/14/2017 Start Time: 3:30 PM End Time: 3:55 PM  Chief Complaint: Chief Complaint  Patient presents with  . ADHD  . Follow-up   Subjective: I am doing ok  This patient is a 21 year-old black male who lives with his mother grandmother and 2 uncles in [place]. He is taking classes at [employer] But he is taking a break this semester  Apparently the patient was born at 63 weeks due to placenta disruption. He was in the NICU for 4 months. He had delayed milestones and needed speech and physical therapy. 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  The patient used to have a lot of disruptive agitated behaviors  but he is doing much better now on a combination of intuitive and Focalin he's not had any significant side effects. He does have an IEP at school and his grades are usually C's and D's  The patient returns for followup after 3 months. He states that he is taking a break from his college classes because his financial aid was canceled. He is helping his mom around the house and will resume classes in the summer. He states that he staying focused on his medication his behaviors goodies no longer having temper tantrums and he is sleeping and eating well.  Suicidal Ideation: No Plan Formed: No Patient has means to carry out plan: No  Homicidal Ideation: No Plan Formed: No Patient has means to carry out plan: No  Review of Systems: Psychiatric: Agitation: No Hallucination: No Depressed Mood: No Insomnia: No Hypersomnia: No Altered Concentration: yes Feels Worthless: No Grandiose Ideas: No Belief In Special Powers: No New/Increased Substance Abuse: No Compulsions: No  Neurologic: Headache: No Seizure: No Paresthesias: No  Past Medical Family, Social History: He is attending a remedial program at [employer] Allergies: Allergies  Allergen Reactions  . Abilify [Aripiprazole] Other (See Comments)    Altered mental state  . Amoxicillin-Pot Clavulanate Rash   Medical History: Past Medical History:  Diagnosis Date  .  ADHD (attention deficit hyperactivity disorder)   . Asthma    seasonal related  . Bronchitis   . Chromosomal abnormality   . Diabetes mellitus type II   . Oppositional defiant disorder   . Premature birth   . Seasonal allergies   . Sleep apnoea, obstructed    Surgical History: Past Surgical History:  Procedure Laterality Date  . TONSILLECTOMY AND ADENOIDECTOMY    . Tubes in ears during early childhood     Family History: family history includes Alcohol abuse in his father; Anxiety disorder in his maternal grandmother; Autism spectrum disorder  in his cousin; Drug abuse in his father; Seizures in his paternal uncle. Reviewed and nothing new today.  Outpatient Encounter Prescriptions as of 03/14/2017  Medication Sig  . cetirizine (ZYRTEC) 10 MG tablet Take 10 mg by mouth daily.  Marland Kitchen. FOCALIN XR 35 MG CP24 Take 35 mg by mouth every morning.  Marland Kitchen. FOCALIN XR 35 MG CP24 Take 35 mg by mouth every morning.  Marland Kitchen. FOCALIN XR 35 MG CP24 Take 35 mg by mouth every morning.  Marland Kitchen. guaiFENesin (DIABETIC TUSSIN EX) 100 MG/5ML liquid Take 200 mg by mouth once as needed. For cough/cold  . guanFACINE (INTUNIV) 4 MG TB24 ER tablet TAKE ONE TABLET BY MOUTH IN THE MORNING  . metFORMIN (GLUCOPHAGE-XR) 750 MG 24 hr tablet Take 1,500 mg by mouth daily with breakfast.  . tretinoin (RETIN-A) 0.025 % cream Apply 1 application topically daily as needed. For skin breakouts  . [DISCONTINUED] Dexmethylphenidate HCl 35 MG CP24 Take 35 mg by mouth every morning.  . [DISCONTINUED] Dexmethylphenidate HCl 35 MG CP24 Take one each am  . [DISCONTINUED] Dexmethylphenidate HCl 35 MG CP24 Take 35 mg by mouth every morning.  . [DISCONTINUED] FOCALIN XR 35 MG CP24 Take 35 mg by mouth every morning.  . [DISCONTINUED] FOCALIN XR 35 MG CP24 Take 35 mg by mouth every morning.  . [DISCONTINUED] FOCALIN XR 35 MG CP24 Take 35 mg by mouth every morning.   No facility-administered encounter medications on file as of 03/14/2017.     Past Psychiatric History/Hospitalization(s): Anxiety: No Bipolar Disorder: Yes Depression: No Mania: No Psychosis: No Schizophrenia: No Personality Disorder: No Hospitalization for psychiatric illness: No History of Electroconvulsive Shock Therapy: No Prior Suicide Attempts: No  Physical Exam: Constitutional:  BP 130/78   Ht 5\' 6"  (1.676 m)   Wt 213 lb (96.6 kg)   BMI 34.38 kg/m   General Appearance: alert, oriented, no acute distress and obese  Musculoskeletal: Strength & Muscle Tone: within normal limits Gait & Station: normal Patient leans:  N/A  Psychiatric: Speech (describe rate, volume, coherence, spontaneity, and abnormalities if any): Normal in volume, rate, tone, spontaneous , stutters a [place] bit  Thought Process (describe rate, content, abstract reasoning, and computation): Organized, goal directed, age appropriate   Associations: Intact  Thoughts: normal  Mental Status: Orientation: oriented to person, place and situation Mood & Affect: normal affect Attention Span & Concentration: Fair, improved with medications  Lab Results:  No results found for this or any previous visit (from the past 8736 hour(s)).Labs are good.  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  I reviewed CC, tobacco/med/surg Hx, meds effects/ side effects, problem list, therapies and responses as well as  current situation/symptoms discussed options. He'll continue Focalin xr-now brand name  and Intuniv for ADHD symptoms and return in 3 months See orders and pt instructions for more details.  MEDICATIONS this encounter:   Medical Decision Making Problem Points:  Established problem, worsening (2), Review of last therapy session (1) and Review of psycho-social stressors (1) Data Points:  Review or order clinical lab tests (1) Review of medication regiment & side effects (2) Review of new medications or change in dosage (2)  I certify that outpatient services furnished can reasonably be expected to improve the patient's condition.   [name], MD

## 2017-05-25 ENCOUNTER — Other Ambulatory Visit (HOSPITAL_COMMUNITY): Payer: Self-pay | Admitting: Psychiatry

## 2017-05-25 DIAGNOSIS — F902 Attention-deficit hyperactivity disorder, combined type: Secondary | ICD-10-CM

## 2017-06-11 ENCOUNTER — Encounter (HOSPITAL_COMMUNITY): Payer: Self-pay | Admitting: Psychiatry

## 2017-06-11 ENCOUNTER — Ambulatory Visit (INDEPENDENT_AMBULATORY_CARE_PROVIDER_SITE_OTHER): Payer: Medicaid Other | Admitting: Psychiatry

## 2017-06-11 VITALS — BP 128/86 | HR 88 | Ht 66.0 in | Wt 216.6 lb

## 2017-06-11 DIAGNOSIS — Z79899 Other long term (current) drug therapy: Secondary | ICD-10-CM

## 2017-06-11 DIAGNOSIS — Z881 Allergy status to other antibiotic agents status: Secondary | ICD-10-CM | POA: Diagnosis not present

## 2017-06-11 DIAGNOSIS — E119 Type 2 diabetes mellitus without complications: Secondary | ICD-10-CM | POA: Diagnosis not present

## 2017-06-11 DIAGNOSIS — Z818 Family history of other mental and behavioral disorders: Secondary | ICD-10-CM

## 2017-06-11 DIAGNOSIS — Z7984 Long term (current) use of oral hypoglycemic drugs: Secondary | ICD-10-CM | POA: Diagnosis not present

## 2017-06-11 DIAGNOSIS — J45909 Unspecified asthma, uncomplicated: Secondary | ICD-10-CM

## 2017-06-11 DIAGNOSIS — Z814 Family history of other substance abuse and dependence: Secondary | ICD-10-CM

## 2017-06-11 DIAGNOSIS — Z811 Family history of alcohol abuse and dependence: Secondary | ICD-10-CM

## 2017-06-11 DIAGNOSIS — Q929 Trisomy and partial trisomy of autosomes, unspecified: Secondary | ICD-10-CM | POA: Diagnosis not present

## 2017-06-11 DIAGNOSIS — G43909 Migraine, unspecified, not intractable, without status migrainosus: Secondary | ICD-10-CM

## 2017-06-11 DIAGNOSIS — Z888 Allergy status to other drugs, medicaments and biological substances status: Secondary | ICD-10-CM | POA: Diagnosis not present

## 2017-06-11 DIAGNOSIS — F39 Unspecified mood [affective] disorder: Secondary | ICD-10-CM | POA: Diagnosis not present

## 2017-06-11 DIAGNOSIS — G4730 Sleep apnea, unspecified: Secondary | ICD-10-CM

## 2017-06-11 DIAGNOSIS — F902 Attention-deficit hyperactivity disorder, combined type: Secondary | ICD-10-CM

## 2017-06-11 MED ORDER — FOCALIN XR 35 MG PO CP24: 35.0000 mg | ORAL_CAPSULE | ORAL | 0 refills | Status: DC

## 2017-06-11 MED ORDER — GUANFACINE HCL ER 4 MG PO TB24: 4.0000 mg | ORAL_TABLET | Freq: Every morning | ORAL | 2 refills | Status: DC

## 2017-06-11 NOTE — Progress Notes (Signed)
Patient ID: Aaron Dean, male   DOB: 10/09/1996, 21 y.o.   MRN: [id number] Patient ID: Aaron Dean, male   DOB: 10/09/1996, 21 y.o.   MRN: [id number] Patient ID: Aaron Dean, male   DOB: 10/09/1996, 21 y.o.   MRN: [id number] Patient ID: Aaron Dean, male   DOB: 10/09/1996, 21 y.o.   MRN: [id number] Patient ID: Aaron Dean, male   DOB: 10/09/1996, 21 y.o.   MRN: [id number] Patient ID: Aaron Dean, male   DOB: 10/09/1996, 21 y.o.   MRN: [id number] Patient ID: Aaron Dean, male   DOB: 10/09/1996, 21 y.o.   MRN: [id number] Patient ID: Aaron Dean, male   DOB: 10/09/1996, 21 y.o.   MRN: [id number] Patient ID: Aaron Dean, male   DOB: 10/09/1996, 21 y.o.   MRN: [id number] Patient ID: Aaron Dean, male   DOB: 10/09/1996, 21 y.o.   MRN: [id number] Patient ID: Aaron CoppBraxton L Scorza, male   DOB: 10/09/1996, 21 y.o.   MRN: [id number] Patient ID: Aaron CoppBraxton L Ferencz, male   DOB: 10/09/1996, 21 y.o.   MRN: [id number] Patient ID: Aaron CoppBraxton L Sikora, male   DOB: 10/09/1996, 21 y.o.   MRN: [id number] Patient ID: Aaron CoppBraxton L Latella, male   DOB: 10/09/1996, 21 y.o.   MRN: [id number] [facility] [id number] Progress Note Aaron CoppBraxton L Kaczmarczyk MRN: [id number] DOB: 10/09/1996 Age: 21 y.o.  Date: 06/11/2017 Start Time: 3:30 PM End Time: 3:55 PM  Chief Complaint: Chief Complaint  Patient presents with  . ADHD  . Follow-up   Subjective: I am doing ok  This patient is a 21 year-old black male who lives with his mother grandmother and 2 uncles in [place]. He is taking classes at [employer] But he is taking a break this semester  Apparently the patient was born at 3928 weeks due to placenta disruption. He was in the NICU for 4 months. He had delayed milestones and needed speech and physical therapy. 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  The patient used to have a lot of disruptive agitated behaviors  but he is doing much better now on a combination of intuitive and Focalin he's not had any significant side effects. He does have an IEP at school and his grades are usually C's and D's  The patient returns for followup after 3 months. He is still not back in community college. He is helping his mother and uncle. He states that his mood is good and he is trying to regain financial aid. He failed the colleges that he took in the past. He states that he still staying focused his temper is under good control and he is sleeping and eating well.his mood appears to be good  Suicidal Ideation: No Plan Formed: No Patient has means to carry out plan: No  Homicidal Ideation: No Plan Formed: No Patient has means to carry out plan: No  Review of Systems: Psychiatric: Agitation: No Hallucination: No Depressed Mood: No Insomnia: No Hypersomnia: No Altered Concentration: yes Feels Worthless: No Grandiose Ideas: No Belief In Special Powers: No New/Increased Substance Abuse: No Compulsions: No  Neurologic: Headache: No Seizure: No Paresthesias: No  Past Medical Family, Social History: He is attending a remedial program at [employer] Allergies: Allergies  Allergen Reactions  . Abilify [Aripiprazole] Other (See Comments)    Altered mental state  . Amoxicillin-Pot Clavulanate Rash   Medical  History: Past Medical History:  Diagnosis Date  . ADHD (attention deficit hyperactivity disorder)   . Asthma    seasonal related  . Bronchitis   . Chromosomal abnormality   . Diabetes mellitus type II   . Oppositional defiant disorder   . Premature birth   . Seasonal allergies   . Sleep apnoea, obstructed    Surgical History: Past Surgical History:  Procedure Laterality Date  . TONSILLECTOMY AND ADENOIDECTOMY    . Tubes in ears during early childhood     Family History: family history includes Alcohol abuse in his father; Anxiety disorder in his maternal grandmother; Autism  spectrum disorder in his cousin; Drug abuse in his father; Seizures in his paternal uncle. Reviewed and nothing new today.  Outpatient Encounter Prescriptions as of 06/11/2017  Medication Sig  . cetirizine (ZYRTEC) 10 MG tablet Take 10 mg by mouth daily.  Marland Kitchen FOCALIN XR 35 MG CP24 Take 35 mg by mouth every morning.  Marland Kitchen FOCALIN XR 35 MG CP24 Take 35 mg by mouth every morning.  Marland Kitchen FOCALIN XR 35 MG CP24 Take 35 mg by mouth every morning.  Marland Kitchen guaiFENesin (DIABETIC TUSSIN EX) 100 MG/5ML liquid Take 200 mg by mouth once as needed. For cough/cold  . guanFACINE (INTUNIV) 4 MG TB24 ER tablet Take 1 tablet (4 mg total) by mouth every morning.  . metFORMIN (GLUCOPHAGE-XR) 750 MG 24 hr tablet Take 1,500 mg by mouth daily with breakfast.  . tretinoin (RETIN-A) 0.025 % cream Apply 1 application topically daily as needed. For skin breakouts  . [DISCONTINUED] FOCALIN XR 35 MG CP24 Take 35 mg by mouth every morning.  . [DISCONTINUED] FOCALIN XR 35 MG CP24 Take 35 mg by mouth every morning.  . [DISCONTINUED] FOCALIN XR 35 MG CP24 Take 35 mg by mouth every morning.  . [DISCONTINUED] guanFACINE (INTUNIV) 4 MG TB24 ER tablet TAKE ONE TABLET BY MOUTH IN THE MORNING   No facility-administered encounter medications on file as of 06/11/2017.     Past Psychiatric History/Hospitalization(s): Anxiety: No Bipolar Disorder: Yes Depression: No Mania: No Psychosis: No Schizophrenia: No Personality Disorder: No Hospitalization for psychiatric illness: No History of Electroconvulsive Shock Therapy: No Prior Suicide Attempts: No  Physical Exam: Constitutional:  BP 128/86 (BP Location: Right Arm, Patient Position: Sitting, Cuff Size: Large)   Pulse 88   Ht 5\' 6"  (1.676 m)   Wt 216 lb 9.6 oz (98.2 kg)   SpO2 96%   BMI 34.96 kg/m   General Appearance: alert, oriented, no acute distress and obese  Musculoskeletal: Strength & Muscle Tone: within normal limits Gait & Station: normal Patient leans:  N/A  Psychiatric: Speech (describe rate, volume, coherence, spontaneity, and abnormalities if any): Normal in volume, rate, tone, spontaneous , stutters a [place] bit  Thought Process (describe rate, content, abstract reasoning, and computation): Organized, goal directed, age appropriate   Associations: Intact  Thoughts: normal  Mental Status: Orientation: oriented to person, place and situation Mood & Affect: normal affect Attention Span & Concentration: Fair, improved with medications  Lab Results:  No results found for this or any previous visit (from the past 8736 hour(s)).Labs are good.  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  I reviewed CC, tobacco/med/surg Hx, meds effects/ side effects, problem list, therapies  and responses as well as current situation/symptoms discussed options. He'll continue Focalin xr-now brand name  and Intuniv for ADHD symptoms and return in 3 months See orders and pt instructions for more details.  MEDICATIONS this encounter:   Medical Decision Making Problem Points:  Established problem, worsening (2), Review of last therapy session (1) and Review of psycho-social stressors (1) Data Points:  Review or order clinical lab tests (1) Review of medication regiment & side effects (2) Review of new medications or change in dosage (2)  I certify that outpatient services furnished can reasonably be expected to improve the patient's condition.   [name], MD

## 2017-09-10 ENCOUNTER — Ambulatory Visit (INDEPENDENT_AMBULATORY_CARE_PROVIDER_SITE_OTHER): Payer: Medicaid Other | Admitting: Psychiatry

## 2017-09-10 ENCOUNTER — Encounter (HOSPITAL_COMMUNITY): Payer: Self-pay | Admitting: Psychiatry

## 2017-09-10 VITALS — BP 140/74 | HR 93 | Ht 66.0 in | Wt 203.0 lb

## 2017-09-10 DIAGNOSIS — Z79899 Other long term (current) drug therapy: Secondary | ICD-10-CM

## 2017-09-10 DIAGNOSIS — G4733 Obstructive sleep apnea (adult) (pediatric): Secondary | ICD-10-CM

## 2017-09-10 DIAGNOSIS — J45909 Unspecified asthma, uncomplicated: Secondary | ICD-10-CM | POA: Diagnosis not present

## 2017-09-10 DIAGNOSIS — E669 Obesity, unspecified: Secondary | ICD-10-CM

## 2017-09-10 DIAGNOSIS — F902 Attention-deficit hyperactivity disorder, combined type: Secondary | ICD-10-CM

## 2017-09-10 DIAGNOSIS — F39 Unspecified mood [affective] disorder: Secondary | ICD-10-CM | POA: Diagnosis not present

## 2017-09-10 DIAGNOSIS — G43909 Migraine, unspecified, not intractable, without status migrainosus: Secondary | ICD-10-CM | POA: Diagnosis not present

## 2017-09-10 DIAGNOSIS — Q999 Chromosomal abnormality, unspecified: Secondary | ICD-10-CM

## 2017-09-10 DIAGNOSIS — H5210 Myopia, unspecified eye: Secondary | ICD-10-CM

## 2017-09-10 DIAGNOSIS — Z6832 Body mass index (BMI) 32.0-32.9, adult: Secondary | ICD-10-CM

## 2017-09-10 MED ORDER — FOCALIN XR 35 MG PO CP24: 35.0000 mg | ORAL_CAPSULE | ORAL | 0 refills | Status: DC

## 2017-09-10 MED ORDER — GUANFACINE HCL ER 4 MG PO TB24: 4.0000 mg | ORAL_TABLET | Freq: Every morning | ORAL | 2 refills | Status: DC

## 2017-09-10 NOTE — Progress Notes (Signed)
Patient ID: Aaron Dean, male   DOB: 1996-06-15, 21 y.o.   MRN: [id number] Patient ID: Aaron Dean, male   DOB: [birth year], 21 y.o.   MRN: [id number] Patient ID: Aaron Dean, male   DOB: 02/26/1996, 21 y.o.   MRN: [id number] Patient ID: Aaron Dean, male   DOB: 07-30-1996, 21 y.o.   MRN: [id number] Patient ID: Aaron Dean, male   DOB: Aug 14, 1996, 21 y.o.   MRN: [id number] Patient ID: Aaron Dean, male   DOB: 1996/07/14, 21 y.o.   MRN: [id number] Patient ID: Aaron Dean, male   DOB: 1996/05/14, 21 y.o.   MRN: [id number] Patient ID: Aaron Dean, male   DOB: 1996/06/04, 21 y.o.   MRN: [id number] Patient ID: Aaron Dean, male   DOB: 05-10-1996, 20 y.o.   MRN: [id number] Patient ID: Aaron Dean, male   DOB: Jun 15, 1996, 21 y.o.   MRN: [id number] Patient ID: Aaron Dean, male   DOB: 1996-06-04, 21 y.o.   MRN: [id number] Patient ID: Aaron Dean, male   DOB: 25-Jan-1996, 21 y.o.   MRN: [id number] Patient ID: Aaron Dean, male   DOB: Jul 30, 1996, 21 y.o.   MRN: [id number] Patient ID: Aaron Dean, male   DOB: 19-Nov-1996, 21 y.o.   MRN: [id number] [facility] [id number] Progress Note Aaron Dean MRN: [id number] DOB: Jun 02, 1996 Age: 21 y.o.  Date: 09/10/2017 Start Time: 3:30 PM End Time: 3:55 PM  Chief Complaint: Chief Complaint  Patient presents with  . ADHD  . Follow-up   Subjective: I am doing ok  This patient is a 21 year-old black male who lives with his mother grandmother and 2 uncles in [place]. He is taking classes at [employer]   Apparently the patient was born at 28 weeks due to placenta disruption. He was in the NICU for 4 months. He had delayed milestones and needed speech and physical therapy. 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  The patient used to have a lot of disruptive agitated behaviors but he is doing much better now on a  combination of intuitive and Focalin he's not had any significant side effects. He does have an IEP at school and his grades are usually C's and D's  The patient returns for followup after 3 months. He is now back at [employer]. He's taking a math class in a college readiness class. He is getting a lot of help from the student skills center. So far he is doing well. He is staying focused and his mood is good  Suicidal Ideation: No Plan Formed: No Patient has means to carry out plan: No  Homicidal Ideation: No Plan Formed: No Patient has means to carry out plan: No  Review of Systems: Psychiatric: Agitation: No Hallucination: No Depressed Mood: No Insomnia: No Hypersomnia: No Altered Concentration: yes Feels Worthless: No Grandiose Ideas: No Belief In Special Powers: No New/Increased Substance Abuse: No Compulsions: No  Neurologic: Headache: No Seizure: No Paresthesias: No  Past Medical Family, Social History: He is attending a remedial program at [employer] Allergies: Allergies  Allergen Reactions  . Abilify [Aripiprazole] Other (See Comments)    Altered mental state  . Amoxicillin-Pot Clavulanate Rash   Medical History: Past Medical History:  Diagnosis Date  . ADHD (attention deficit hyperactivity disorder)   . Asthma    seasonal related  . Bronchitis   .  Chromosomal abnormality   . Diabetes mellitus type II   . Oppositional defiant disorder   . Premature birth   . Seasonal allergies   . Sleep apnoea, obstructed    Surgical History: Past Surgical History:  Procedure Laterality Date  . TONSILLECTOMY AND ADENOIDECTOMY    . Tubes in ears during early childhood     Family History: family history includes Alcohol abuse in his father; Anxiety disorder in his maternal grandmother; Autism spectrum disorder in his cousin; Drug abuse in his father; Seizures in his paternal uncle. Reviewed and nothing new today.  Outpatient Encounter  Prescriptions as of 09/10/2017  Medication Sig  . cetirizine (ZYRTEC) 10 MG tablet Take 10 mg by mouth daily.  Marland Kitchen FOCALIN XR 35 MG CP24 Take 35 mg by mouth every morning.  Marland Kitchen FOCALIN XR 35 MG CP24 Take 35 mg by mouth every morning.  Marland Kitchen FOCALIN XR 35 MG CP24 Take 35 mg by mouth every morning.  Marland Kitchen guaiFENesin (DIABETIC TUSSIN EX) 100 MG/5ML liquid Take 200 mg by mouth once as needed. For cough/cold  . guanFACINE (INTUNIV) 4 MG TB24 ER tablet Take 1 tablet (4 mg total) by mouth every morning.  . metFORMIN (GLUCOPHAGE-XR) 750 MG 24 hr tablet Take 1,500 mg by mouth daily with breakfast.  . tretinoin (RETIN-A) 0.025 % cream Apply 1 application topically daily as needed. For skin breakouts  . [DISCONTINUED] FOCALIN XR 35 MG CP24 Take 35 mg by mouth every morning.  . [DISCONTINUED] FOCALIN XR 35 MG CP24 Take 35 mg by mouth every morning.  . [DISCONTINUED] FOCALIN XR 35 MG CP24 Take 35 mg by mouth every morning.  . [DISCONTINUED] guanFACINE (INTUNIV) 4 MG TB24 ER tablet Take 1 tablet (4 mg total) by mouth every morning.   No facility-administered encounter medications on file as of 09/10/2017.     Past Psychiatric History/Hospitalization(s): Anxiety: No Bipolar Disorder: Yes Depression: No Mania: No Psychosis: No Schizophrenia: No Personality Disorder: No Hospitalization for psychiatric illness: No History of Electroconvulsive Shock Therapy: No Prior Suicide Attempts: No  Physical Exam: Constitutional:  BP 140/74   Pulse 93   Ht  (1.676 m)   Wt 203 lb (92.1 kg)   BMI 32.77 kg/m   General Appearance: alert, oriented, no acute distress and obese  Musculoskeletal: Strength & Muscle Tone: within normal limits Gait & Station: normal Patient leans: N/A  Psychiatric: Speech (describe rate, volume, coherence, spontaneity, and abnormalities if any): Normal in volume, rate, tone, spontaneous , stutters a [place] bit  Thought Process (describe rate, content, abstract reasoning, and  computation): Organized, goal directed, age appropriate   Associations: Intact  Thoughts: normal  Mental Status: Orientation: oriented to person, place and situation Mood & Affect: normal affect Attention Span & Concentration: Fair, improved with medications  Lab Results:  No results found for this or any previous visit (from the past 8736 hour(s)).Labs are good.  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  I reviewed CC, tobacco/med/surg Hx, meds effects/ side effects, problem list, therapies and responses as well as current situation/symptoms discussed options. He'll continue Focalin xr-now brand name  and Intuniv for ADHD symptoms and return in 3 months See orders and pt instructions for more details.  MEDICATIONS this encounter:   Medical Decision Making  Problem Points:  Established problem, worsening (2), Review of last therapy session (1) and Review of psycho-social stressors (1) Data Points:  Review or order clinical lab tests (1) Review of medication regiment & side effects (2) Review of new medications or change in dosage (2)  I certify that outpatient services furnished can reasonably be expected to improve the patient's condition.   [name], MD

## 2017-12-05 ENCOUNTER — Ambulatory Visit (HOSPITAL_COMMUNITY): Payer: Medicaid Other | Admitting: Psychiatry

## 2017-12-10 ENCOUNTER — Ambulatory Visit (HOSPITAL_COMMUNITY): Payer: Medicaid Other | Admitting: Psychiatry

## 2017-12-11 ENCOUNTER — Encounter (HOSPITAL_COMMUNITY): Payer: Self-pay | Admitting: Psychiatry

## 2017-12-11 ENCOUNTER — Ambulatory Visit (INDEPENDENT_AMBULATORY_CARE_PROVIDER_SITE_OTHER): Payer: Medicaid Other | Admitting: Psychiatry

## 2017-12-11 DIAGNOSIS — Z818 Family history of other mental and behavioral disorders: Secondary | ICD-10-CM

## 2017-12-11 DIAGNOSIS — F902 Attention-deficit hyperactivity disorder, combined type: Secondary | ICD-10-CM | POA: Diagnosis not present

## 2017-12-11 DIAGNOSIS — Z811 Family history of alcohol abuse and dependence: Secondary | ICD-10-CM

## 2017-12-11 DIAGNOSIS — Z813 Family history of other psychoactive substance abuse and dependence: Secondary | ICD-10-CM

## 2017-12-11 MED ORDER — FOCALIN XR 35 MG PO CP24: 35.0000 mg | ORAL_CAPSULE | ORAL | 0 refills | Status: DC

## 2017-12-11 MED ORDER — GUANFACINE HCL ER 4 MG PO TB24: 4.0000 mg | ORAL_TABLET | Freq: Every morning | ORAL | 2 refills | Status: DC

## 2017-12-11 NOTE — Progress Notes (Signed)
BH MD/PA/NP OP Progress Note  12/11/2017 2:03 PM Aaron Dean  MRN:  [id number]  Chief Complaint:  Chief Complaint    ADHD; Follow-up     HPI: This patient is a 21 year-old black male who lives with his mother grandmother and 2 uncles in [place]. He is taking [facility] [employer]   Apparently the patient was born at 28 weeks due to placenta disruption. He was in the NICU for 4 months. He had delayed milestones and needed speech and physical therapy. 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  The patient used to have a lot of disruptive agitated behaviors but he is doing much better now on a combination of intuitive and Focalin he's not had any significant side effects. He does have an IEP at school and his grades are usually C's and D's  The patient returns after 3 months.  He still taking a class at [employer] but also got a job through [employer].  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  He seems to like it.  His mood is good and he is not been disruptive or agitated.  He is sleeping and eating well.  He is given up soda so he has been losing some weight.  He is still staying well focused on his medications Visit Diagnosis:    ICD-10-CM   1. ADHD (attention deficit hyperactivity disorder), combined type F90.2 guanFACINE (INTUNIV) 4 MG TB24 ER tablet    Past Psychiatric History: Long-term outpatient treatment  Past Medical History:  Past Medical History:  Diagnosis Date  . ADHD (attention deficit hyperactivity disorder)   . Asthma    seasonal related  . Bronchitis   . Chromosomal abnormality   . Diabetes mellitus type II   . Oppositional defiant disorder   . Premature birth   . Seasonal allergies   . Sleep apnoea, obstructed     Past Surgical History:  Procedure Laterality Date  . TONSILLECTOMY AND ADENOIDECTOMY    . Tubes in ears during early childhood      Family  Psychiatric History: See below  Family History:  Family History  Problem Relation Age of Onset  . Alcohol abuse Father   . Drug abuse Father   . Autism spectrum disorder Cousin   . Seizures Paternal Uncle   . Anxiety disorder Maternal Grandmother   . ADD / ADHD Neg Hx   . Bipolar disorder Neg Hx   . Dementia Neg Hx   . Depression Neg Hx   . OCD Neg Hx   . Paranoid behavior Neg Hx   . Schizophrenia Neg Hx   . Physical abuse Neg Hx   . Sexual abuse Neg Hx     Social History:  Social History   Socioeconomic History  . Marital status: Single    Spouse name: None  . Number of children: None  . Years of education: None  . Highest education level: None  Social Needs  . Financial resource strain: None  . Food insecurity - worry: None  . Food insecurity - inability: None  . Transportation needs - medical: None  . Transportation needs - non-medical: None  Occupational History  . None  Tobacco Use  . Smoking status: Never Smoker  . Smokeless tobacco: Never Used  Substance and Sexual Activity  . Alcohol use: No  . Drug use: No  . Sexual activity: No  Other Topics Concern  . None  Social History Narrative  . None    Allergies:  Allergies  Allergen Reactions  . Abilify [Aripiprazole] Other (See Comments)    Altered mental state  . Amoxicillin-Pot Clavulanate Rash    Metabolic Disorder Labs: No results found for: HGBA1C, MPG No results found for: PROLACTIN No results found for: CHOL, TRIG, HDL, CHOLHDL, VLDL, LDLCALC No results found for: TSH  Therapeutic Level Labs: No results found for: LITHIUM No results found for: VALPROATE No components found for:  CBMZ  Current Medications: Current Outpatient Medications  Medication Sig Dispense Refill  . cetirizine (ZYRTEC) 10 MG tablet Take 10 mg by mouth daily.    . FOCALIN XR 35 MG CP24 TaMarland Kitchenke 35 mg by mouth every morning. 30 capsule 0  . FOCALIN XR 35 MG CP24 Take 35 mg by mouth every morning. 30 capsule 0  .  FOCALIN XR 35 MG CP24 Take 35 mg by mouth every morning. 30 capsule 0  . guaiFENesin (DIABETIC TUSSIN EX) 100 MG/5ML liquid Take 200 mg by mouth once as needed. For cough/cold    . guanFACINE (INTUNIV) 4 MG TB24 ER tablet Take 1 tablet (4 mg total) by mouth every morning. 30 tablet 2  . metFORMIN (GLUCOPHAGE-XR) 750 MG 24 hr tablet Take 1,500 mg by mouth daily with breakfast.    . tretinoin (RETIN-A) 0.025 % cream Apply 1 application topically daily as needed. For skin breakouts     No current facility-administered medications for this visit.      Musculoskeletal: Strength & Muscle Tone: within normal limits Gait & Station: normal Patient leans: N/A  Psychiatric Specialty Exam: Review of Systems  HENT: Positive for congestion.   All other systems reviewed and are negative.   Blood pressure 121/75, pulse (!) 101, height 5\' 6"  (1.676 m), weight 205 lb (93 kg), SpO2 97 %.Body mass index is 33.09 kg/m.  General Appearance: Casual, Neat and Well Groomed  Eye Contact:  Good  Speech:  Clear and Coherent  Volume:  Normal  Mood:  Euthymic  Affect:  Congruent  Thought Process:  Goal Directed  Orientation:  Full (Time, Place, and Person)  Thought Content: WDL   Suicidal Thoughts:  No  Homicidal Thoughts:  No  Memory:  Immediate;   Good Recent;   Fair Remote;   Poor  Judgement:  Impaired  Insight:  Lacking  Psychomotor Activity:  Restlessness  Concentration:  Concentration: Good and Attention Span: Good  Recall:  Good  Fund of Knowledge: Fair  Language: Good  Akathisia:  No  Handed:  Right  AIMS (if indicated): not done  Assets:  Communication Skills Desire for Improvement Physical Health Resilience Social Support  ADL's:  Intact  Cognition: Impaired,  Mild  Sleep:  Good   Screenings:   Assessment and Plan: Patient is a 21 year old male with a history of agitated behavior ADHD and developmental delays.  He has actually done well  working part-time and attending school  and trying to go at his own rate.  He is doing well on the Intuniv 4 mg every morning for ADHD and agitation and Focalin XR 35 mg every morning for ADHD.  He will continue these medications and return to see me in 3 months   Diannia Rudereborah Ross, MD 12/11/2017, 2:03 PM

## 2018-03-11 ENCOUNTER — Ambulatory Visit (INDEPENDENT_AMBULATORY_CARE_PROVIDER_SITE_OTHER): Payer: Medicaid Other | Admitting: Psychiatry

## 2018-03-11 ENCOUNTER — Encounter (HOSPITAL_COMMUNITY): Payer: Self-pay | Admitting: Psychiatry

## 2018-03-11 DIAGNOSIS — Q999 Chromosomal abnormality, unspecified: Secondary | ICD-10-CM

## 2018-03-11 DIAGNOSIS — Z818 Family history of other mental and behavioral disorders: Secondary | ICD-10-CM | POA: Diagnosis not present

## 2018-03-11 DIAGNOSIS — Z811 Family history of alcohol abuse and dependence: Secondary | ICD-10-CM | POA: Diagnosis not present

## 2018-03-11 DIAGNOSIS — F902 Attention-deficit hyperactivity disorder, combined type: Secondary | ICD-10-CM | POA: Diagnosis not present

## 2018-03-11 DIAGNOSIS — E119 Type 2 diabetes mellitus without complications: Secondary | ICD-10-CM

## 2018-03-11 DIAGNOSIS — Z813 Family history of other psychoactive substance abuse and dependence: Secondary | ICD-10-CM

## 2018-03-11 DIAGNOSIS — F70 Mild intellectual disabilities: Secondary | ICD-10-CM | POA: Diagnosis not present

## 2018-03-11 DIAGNOSIS — Z79899 Other long term (current) drug therapy: Secondary | ICD-10-CM

## 2018-03-11 DIAGNOSIS — Z810 Family history of intellectual disabilities: Secondary | ICD-10-CM

## 2018-03-11 MED ORDER — FOCALIN XR 35 MG PO CP24: 35.0000 mg | ORAL_CAPSULE | ORAL | 0 refills | Status: DC

## 2018-03-11 MED ORDER — GUANFACINE HCL ER 4 MG PO TB24: 4.0000 mg | ORAL_TABLET | Freq: Every morning | ORAL | 2 refills | Status: DC

## 2018-03-11 NOTE — Progress Notes (Signed)
BH MD/PA/NP OP Progress Note  03/11/2018 3:17 PM Aaron Dean  MRN:  [id number]  Chief Complaint:  Chief Complaint    ADHD; Follow-up     HPI: This patient is a 22 year-old black male who lives with his mother grandmother and 2 uncles in [place]. He is [facility]   Apparently the patient was born at 28 weeks due to placenta disruption. He was in the NICU for 4 months. He had delayed milestones and needed speech and physical therapy. 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  The patient used to have a lot of disruptive agitated behaviors but he is doing much better now on a combination of intuitive and Focalin   Patient returns after 3 months.  He is doing exceptionally well.  He has been [facility] [facility] and is also been working out in the gym quite a bit.  He has lost 18 pounds and is really watching his food.  He is doing so well that his endocrinologist took him off metformin.  He states that he is doing well on his classes and is staying focused.  He is not had any disruptive or agitated behaviors and he is sleeping well Visit Diagnosis:    ICD-10-CM   1. ADHD (attention deficit hyperactivity disorder), combined type F90.2 guanFACINE (INTUNIV) 4 MG TB24 ER tablet    Past Psychiatric History: Long-term outpatient treatment  Past Medical History:  Past Medical History:  Diagnosis Date  . ADHD (attention deficit hyperactivity disorder)   . Asthma    seasonal related  . Bronchitis   . Chromosomal abnormality   . Diabetes mellitus type II   . Oppositional defiant disorder   . Premature birth   . Seasonal allergies   . Sleep apnoea, obstructed     Past Surgical History:  Procedure Laterality Date  . TONSILLECTOMY AND ADENOIDECTOMY    . Tubes in ears during early childhood      Family Psychiatric History: See below  Family History:  Family History  Problem  Relation Age of Onset  . Alcohol abuse Father   . Drug abuse Father   . Autism spectrum disorder Cousin   . Seizures Paternal Uncle   . Anxiety disorder Maternal Grandmother   . ADD / ADHD Neg Hx   . Bipolar disorder Neg Hx   . Dementia Neg Hx   . Depression Neg Hx   . OCD Neg Hx   . Paranoid behavior Neg Hx   . Schizophrenia Neg Hx   . Physical abuse Neg Hx   . Sexual abuse Neg Hx     Social History:  Social History   Socioeconomic History  . Marital status: Single    Spouse name: None  . Number of children: None  . Years of education: None  . Highest education level: None  Social Needs  . Financial resource strain: None  . Food insecurity - worry: None  . Food insecurity - inability: None  . Transportation needs - medical: None  . Transportation needs - non-medical: None  Occupational History  . None  Tobacco Use  . Smoking status: Never Smoker  . Smokeless tobacco: Never Used  Substance and Sexual Activity  . Alcohol use: No  . Drug use: No  . Sexual activity: No  Other Topics Concern  . None  Social History Narrative  . None  Allergies:  Allergies  Allergen Reactions  . Abilify [Aripiprazole] Other (See Comments)    Altered mental state  . Amoxicillin-Pot Clavulanate Rash    Metabolic Disorder Labs: No results found for: HGBA1C, MPG No results found for: PROLACTIN No results found for: CHOL, TRIG, HDL, CHOLHDL, VLDL, LDLCALC No results found for: TSH  Therapeutic Level Labs: No results found for: LITHIUM No results found for: VALPROATE No components found for:  CBMZ  Current Medications: Current Outpatient Medications  Medication Sig Dispense Refill  . cetirizine (ZYRTEC) 10 MG tablet Take 10 mg by mouth daily.    Marland Kitchen FOCALIN XR 35 MG CP24 Take 35 mg by mouth every morning. 30 capsule 0  . FOCALIN XR 35 MG CP24 Take 35 mg by mouth every morning. 30 capsule 0  . FOCALIN XR 35 MG CP24 Take 35 mg by mouth every morning. 30 capsule 0  .  guaiFENesin (DIABETIC TUSSIN EX) 100 MG/5ML liquid Take 200 mg by mouth once as needed. For cough/cold    . guanFACINE (INTUNIV) 4 MG TB24 ER tablet Take 1 tablet (4 mg total) by mouth every morning. 30 tablet 2  . tretinoin (RETIN-A) 0.025 % cream Apply 1 application topically daily as needed. For skin breakouts     No current facility-administered medications for this visit.      Musculoskeletal: Strength & Muscle Tone: within normal limits Gait & Station: normal Patient leans: N/A  Psychiatric Specialty Exam: Review of Systems  All other systems reviewed and are negative.   Blood pressure (!) 104/93, pulse 84, height 5\' 6"  (1.676 m), weight 198 lb (89.8 kg), SpO2 98 %.Body mass index is 31.96 kg/m.  General Appearance: Casual and Fairly Groomed  Eye Contact:  Fair  Speech:  Garbled  Volume:  Normal  Mood:  Euthymic  Affect:  Congruent  Thought Process:  Goal Directed  Orientation:  Full (Time, Place, and Person)  Thought Content: Logical   Suicidal Thoughts:  No  Homicidal Thoughts:  No  Memory:  Immediate;   Good Recent;   Good Remote;   Fair  Judgement:  Fair  Insight:  Lacking  Psychomotor Activity:  Restlessness  Concentration:  Concentration: Good and Attention Span: Good  Recall:  [employer] of Knowledge: Fair  Language: Fair  Akathisia:  No  Handed:  Right  AIMS (if indicated): not done  Assets:  Communication Skills Desire for Improvement Physical Health Resilience Social Support  ADL's:  Intact  Cognition: Impaired,  Mild  Sleep:  Good   Screenings:   Assessment and Plan: This patient is a 22 year old 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  He is doing very well on his current medication regimen.  He will continue Intuniv 4 mg daily, Focalin XR 35 mg every morning for ADHD symptoms.  He will return to see me in 3 months   [name], MD 03/11/2018, 3:17 PM

## 2018-05-17 ENCOUNTER — Other Ambulatory Visit (HOSPITAL_COMMUNITY): Payer: Self-pay | Admitting: Psychiatry

## 2018-05-17 DIAGNOSIS — F902 Attention-deficit hyperactivity disorder, combined type: Secondary | ICD-10-CM

## 2018-06-11 ENCOUNTER — Ambulatory Visit (HOSPITAL_COMMUNITY): Payer: Medicaid Other | Admitting: Psychiatry

## 2018-06-17 ENCOUNTER — Other Ambulatory Visit (HOSPITAL_COMMUNITY): Payer: Self-pay | Admitting: Psychiatry

## 2018-06-19 ENCOUNTER — Telehealth (HOSPITAL_COMMUNITY): Payer: Self-pay | Admitting: *Deleted

## 2018-06-19 ENCOUNTER — Other Ambulatory Visit (HOSPITAL_COMMUNITY): Payer: Self-pay | Admitting: Psychiatry

## 2018-06-19 MED ORDER — FOCALIN XR 35 MG PO CP24: 35.0000 mg | ORAL_CAPSULE | ORAL | 0 refills | Status: DC

## 2018-06-19 NOTE — Telephone Encounter (Signed)
Dr Vanetta ShawlHisada Dr Tenny Crawoss patient called requesting a refill on Focalin. He has scheduled appointment with [facility]

## 2018-06-19 NOTE — Telephone Encounter (Signed)
ordered

## 2018-07-15 ENCOUNTER — Ambulatory Visit (INDEPENDENT_AMBULATORY_CARE_PROVIDER_SITE_OTHER): Payer: Medicare Other | Admitting: Psychiatry

## 2018-07-15 ENCOUNTER — Encounter (HOSPITAL_COMMUNITY): Payer: Self-pay | Admitting: Psychiatry

## 2018-07-15 DIAGNOSIS — F902 Attention-deficit hyperactivity disorder, combined type: Secondary | ICD-10-CM | POA: Diagnosis not present

## 2018-07-15 MED ORDER — FOCALIN XR 35 MG PO CP24: 35.0000 mg | ORAL_CAPSULE | ORAL | 0 refills | Status: DC

## 2018-07-15 MED ORDER — GUANFACINE HCL ER 4 MG PO TB24: 4.0000 mg | ORAL_TABLET | Freq: Every morning | ORAL | 2 refills | Status: DC

## 2018-07-15 NOTE — Progress Notes (Signed)
BH MD/PA/NP OP Progress Note  07/15/2018 4:14 PM Aaron Dean  MRN:  [id number]  Chief Complaint:  Chief Complaint    ADHD; Follow-up     HPI: This patient is a 22 year-old black male who lives with his mother grandmother and 2 uncles in [place]. He is taking classes at [facility] [employer]   Apparently the patient was born at 28 weeks due to placenta disruption. He was in the NICU for 4 months. He had delayed milestones and needed speech and physical therapy. 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  The patient used to have a lot of disruptive agitated behaviors but he is doing much better now on a combination of intuitive and Focalin   The patient returns after 3 months.  He continues to do fairly well.  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  He states that he enjoys it.  He is going to be going back to [employer] in the fall and taking more classes.  He is still struggling in math but trying to do the best he can.  His focus has been good and he is not had any other behavioral issues Visit Diagnosis:    ICD-10-CM   1. ADHD (attention deficit hyperactivity disorder), combined type F90.2 guanFACINE (INTUNIV) 4 MG TB24 ER tablet    Past Psychiatric History: Long-term outpatient treatment  Past Medical History:  Past Medical History:  Diagnosis Date  . ADHD (attention deficit hyperactivity disorder)   . Asthma    seasonal related  . Bronchitis   . Chromosomal abnormality   . Diabetes mellitus type II   . Oppositional defiant disorder   . Premature birth   . Seasonal allergies   . Sleep apnoea, obstructed     Past Surgical History:  Procedure Laterality Date  . TONSILLECTOMY AND ADENOIDECTOMY    . Tubes in ears during early childhood      Family Psychiatric History: See below  Family History:  Family History  Problem Relation Age of Onset  . Alcohol abuse Father   . Drug abuse Father   .  Autism spectrum disorder Cousin   . Seizures Paternal Uncle   . Anxiety disorder Maternal Grandmother   . ADD / ADHD Neg Hx   . Bipolar disorder Neg Hx   . Dementia Neg Hx   . Depression Neg Hx   . OCD Neg Hx   . Paranoid behavior Neg Hx   . Schizophrenia Neg Hx   . Physical abuse Neg Hx   . Sexual abuse Neg Hx     Social History:  Social History   Socioeconomic History  . Marital status: Single    Spouse name: Not on file  . Number of children: Not on file  . Years of education: Not on file  . Highest education level: Not on file  Occupational History  . Not on file  Social Needs  . Financial resource strain: Not on file  . Food insecurity:    Worry: Not on file    Inability: Not on file  . Transportation needs:    Medical: Not on file    Non-medical: Not on file  Tobacco Use  . Smoking status: Never Smoker  . Smokeless tobacco: Never Used  Substance and Sexual Activity  . Alcohol use: No  . Drug use: No  . Sexual activity: Never  Lifestyle  .  Physical activity:    Days per week: Not on file    Minutes per session: Not on file  . Stress: Not on file  Relationships  . Social connections:    Talks on phone: Not on file    Gets together: Not on file    Attends religious service: Not on file    Active member of club or organization: Not on file    Attends meetings of clubs or organizations: Not on file    Relationship status: Not on file  Other Topics Concern  . Not on file  Social History Narrative  . Not on file    Allergies:  Allergies  Allergen Reactions  . Abilify [Aripiprazole] Other (See Comments)    Altered mental state  . Amoxicillin-Pot Clavulanate Rash    Metabolic Disorder Labs: No results found for: HGBA1C, MPG No results found for: PROLACTIN No results found for: CHOL, TRIG, HDL, CHOLHDL, VLDL, LDLCALC No results found for: TSH  Therapeutic Level Labs: No results found for: LITHIUM No results found for: VALPROATE No components  found for:  CBMZ  Current Medications: Current Outpatient Medications  Medication Sig Dispense Refill  . cetirizine (ZYRTEC) 10 MG tablet Take 10 mg by mouth daily.    Marland Kitchen. FOCALIN XR 35 MG CP24 Take 35 mg by mouth every morning. 30 capsule 0  . FOCALIN XR 35 MG CP24 Take 35 mg by mouth every morning. 30 capsule 0  . FOCALIN XR 35 MG CP24 Take 35 mg by mouth every morning. 30 capsule 0  . guaiFENesin (DIABETIC TUSSIN EX) 100 MG/5ML liquid Take 200 mg by mouth once as needed. For cough/cold    . guanFACINE (INTUNIV) 4 MG TB24 ER tablet Take 1 tablet (4 mg total) by mouth every morning. 30 tablet 2  . tretinoin (RETIN-A) 0.025 % cream Apply 1 application topically daily as needed. For skin breakouts     No current facility-administered medications for this visit.      Musculoskeletal: Strength & Muscle Tone: within normal limits Gait & Station: normal Patient leans: N/A  Psychiatric Specialty Exam: Review of Systems  All other systems reviewed and are negative.   Blood pressure 136/84, pulse (!) 102, height 5\' 6"  (1.676 m), weight 215 lb (97.5 kg), SpO2 96 %.Body mass index is 34.7 kg/m.  General Appearance: Casual and Fairly Groomed  Eye Contact:  Fair  Speech:  Clear and Coherent  Volume:  Normal  Mood:  Euthymic  Affect:  Congruent  Thought Process:  Goal Directed  Orientation:  Full (Time, Place, and Person)  Thought Content: WDL   Suicidal Thoughts:  No  Homicidal Thoughts:  No  Memory:  Immediate;   Good Recent;   Fair Remote;   Poor  Judgement:  Fair  Insight:  Lacking  Psychomotor Activity:  Restlessness  Concentration:  Concentration: Fair and Attention Span: Fair  Recall:  [employer]  Fund of Knowledge: Fair  Language: Good  Akathisia:  No  Handed:  Right  AIMS (if indicated): not done  Assets:  Communication Skills Desire for Improvement Physical Health Resilience Social Support Talents/Skills  ADL's:  Intact  Cognition: Impaired,  Mild  Sleep:  Good    Screenings:   Assessment and Plan: This patient is a 22 year old male with a history of ADHD.  In the past he had a lot more temper issues but he has been stable for quite a while on his current medication.  He will continue guanfacine 4 mg daily for ADHD  as well as Focalin XR 35 mg nightly for ADHD as well.  He will return to see me in 3 months   [name], MD 07/15/2018, 4:14 PM

## 2018-10-15 ENCOUNTER — Other Ambulatory Visit (HOSPITAL_COMMUNITY): Payer: Self-pay | Admitting: Psychiatry

## 2018-10-15 ENCOUNTER — Encounter (HOSPITAL_COMMUNITY): Payer: Self-pay | Admitting: Psychiatry

## 2018-10-15 ENCOUNTER — Ambulatory Visit (INDEPENDENT_AMBULATORY_CARE_PROVIDER_SITE_OTHER): Payer: Medicare Other | Admitting: Psychiatry

## 2018-10-15 DIAGNOSIS — F902 Attention-deficit hyperactivity disorder, combined type: Secondary | ICD-10-CM

## 2018-10-15 MED ORDER — GUANFACINE HCL ER 4 MG PO TB24: 4.0000 mg | ORAL_TABLET | Freq: Every morning | ORAL | 2 refills | Status: DC

## 2018-10-15 MED ORDER — FOCALIN XR 35 MG PO CP24: 35.0000 mg | ORAL_CAPSULE | ORAL | 0 refills | Status: DC

## 2018-10-15 NOTE — Progress Notes (Signed)
BH MD/PA/NP OP Progress Note  10/15/2018 2:43 PM Aaron Dean  MRN:  [id number]  Chief Complaint:  Chief Complaint    ADHD; Follow-up     HPI: This patient is a 22year-old black male who lives with his mother grandmother and 2 uncles in [place]. He is taking classes at [facility] [employer]   Apparently the patient was born at 28 weeks due to placenta disruption. He was in the NICU for 4 months. He had delayed milestones and needed speech and physical therapy. 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  The patient used to have a lot of disruptive agitated behaviors but he is doing much better now on a combination of intuitive and Focalin  The patient returns after 3 months.  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  They have also given him a remedial tutoring class to help with this.  Nevertheless he is not passing.  I tried to talk to him about perhaps learning a trade or something that would come easier for him but he is adamant that he wants to go to for your college.  He still works at [employer].  He denies any temper tantrums or disruptive behaviors.  His mood is good and he is sleeping well.  He is going to try to exercise more.  His energy is good.  He states that the medications he is on have helped him focus. Visit Diagnosis:    ICD-10-CM   1. ADHD (attention deficit hyperactivity disorder), combined type F90.2 guanFACINE (INTUNIV) 4 MG TB24 ER tablet    Past Psychiatric History: Long-term outpatient treatment  Past Medical History:  Past Medical History:  Diagnosis Date  . ADHD (attention deficit hyperactivity disorder)   . Asthma    seasonal related  . Bronchitis   . Chromosomal abnormality   . Diabetes mellitus type II   . Oppositional defiant disorder   . Premature birth   . Seasonal allergies   . Sleep apnoea, obstructed     Past Surgical History:   Procedure Laterality Date  . TONSILLECTOMY AND ADENOIDECTOMY    . Tubes in ears during early childhood      Family Psychiatric History: See below  Family History:  Family History  Problem Relation Age of Onset  . Alcohol abuse Father   . Drug abuse Father   . Autism spectrum disorder Cousin   . Seizures Paternal Uncle   . Anxiety disorder Maternal Grandmother   . ADD / ADHD Neg Hx   . Bipolar disorder Neg Hx   . Dementia Neg Hx   . Depression Neg Hx   . OCD Neg Hx   . Paranoid behavior Neg Hx   . Schizophrenia Neg Hx   . Physical abuse Neg Hx   . Sexual abuse Neg Hx     Social History:  Social History   Socioeconomic History  . Marital status: Single    Spouse name: Not on file  . Number of children: Not on file  . Years of education: Not on file  . Highest education level: Not on file  Occupational History  . Not on file  Social Needs  . Financial resource strain: Not on file  . Food insecurity:    Worry: Not on file    Inability: Not on file  . Transportation needs:    Medical: Not on  file    Non-medical: Not on file  Tobacco Use  . Smoking status: Never Smoker  . Smokeless tobacco: Never Used  Substance and Sexual Activity  . Alcohol use: No  . Drug use: No  . Sexual activity: Never  Lifestyle  . Physical activity:    Days per week: Not on file    Minutes per session: Not on file  . Stress: Not on file  Relationships  . Social connections:    Talks on phone: Not on file    Gets together: Not on file    Attends religious service: Not on file    Active member of club or organization: Not on file    Attends meetings of clubs or organizations: Not on file    Relationship status: Not on file  Other Topics Concern  . Not on file  Social History Narrative  . Not on file    Allergies:  Allergies  Allergen Reactions  . Abilify [Aripiprazole] Other (See Comments)    Altered mental state  . Amoxicillin-Pot Clavulanate Rash    Metabolic Disorder  Labs: No results found for: HGBA1C, MPG No results found for: PROLACTIN No results found for: CHOL, TRIG, HDL, CHOLHDL, VLDL, LDLCALC No results found for: TSH  Therapeutic Level Labs: No results found for: LITHIUM No results found for: VALPROATE No components found for:  CBMZ  Current Medications: Current Outpatient Medications  Medication Sig Dispense Refill  . cetirizine (ZYRTEC) 10 MG tablet Take 10 mg by mouth daily.    Marland Kitchen FOCALIN XR 35 MG CP24 Take 35 mg by mouth every morning. 30 capsule 0  . FOCALIN XR 35 MG CP24 Take 35 mg by mouth every morning. 30 capsule 0  . FOCALIN XR 35 MG CP24 Take 35 mg by mouth every morning. 30 capsule 0  . guaiFENesin (DIABETIC TUSSIN EX) 100 MG/5ML liquid Take 200 mg by mouth once as needed. For cough/cold    . guanFACINE (INTUNIV) 4 MG TB24 ER tablet Take 1 tablet (4 mg total) by mouth every morning. 30 tablet 2  . tretinoin (RETIN-A) 0.025 % cream Apply 1 application topically daily as needed. For skin breakouts     No current facility-administered medications for this visit.      Musculoskeletal: Strength & Muscle Tone: within normal limits Gait & Station: normal Patient leans: N/A  Psychiatric Specialty Exam: Review of Systems  All other systems reviewed and are negative.   Blood pressure 124/85, pulse 87, height 5\' 6"  (1.676 m), weight 217 lb (98.4 kg), SpO2 98 %.Body mass index is 35.02 kg/m.  General Appearance: Casual and Fairly Groomed  Eye Contact:  Good  Speech:  Clear and Coherent  Volume:  Normal  Mood:  Euthymic  Affect:  Congruent  Thought Process:  Goal Directed  Orientation:  Full (Time, Place, and Person)  Thought Content: WDL   Suicidal Thoughts:  No  Homicidal Thoughts:  No  Memory:  Immediate;   Good Recent;   Good Remote;   Fair  Judgement:  Fair  Insight:  Shallow  Psychomotor Activity:  Normal  Concentration:  Concentration: Good and Attention Span: Good  Recall:  [employer] of Knowledge: Fair   Language: Good  Akathisia:  No  Handed:  Right  AIMS (if indicated): not done  Assets:  Communication Skills Desire for Improvement Physical Health Resilience Social Support Talents/Skills  ADL's:  Intact  Cognition: Impaired,  Mild  Sleep:  Good   Screenings:   Assessment and  Plan: This patient is a 22 year old male with a history of ADHD and learning delays.  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  I have discussed this with his mother but they are both adamant that they want him to continue.  For now he will continue brand-name Focalin XR 35 mg every morning for focus and guanfacine 4 mg every morning also for focus and agitation.  He will return to see me in 3 months   [name], MD 10/15/2018, 2:43 PM

## 2018-10-31 ENCOUNTER — Telehealth (HOSPITAL_COMMUNITY): Payer: Self-pay | Admitting: *Deleted

## 2018-10-31 NOTE — Telephone Encounter (Signed)
[facility]  Approved Focalin XR 35 mg Capsule Effective: 10/30/2018 ---- 12/25/19

## 2018-12-13 ENCOUNTER — Other Ambulatory Visit (HOSPITAL_COMMUNITY): Payer: Self-pay | Admitting: Psychiatry

## 2018-12-13 DIAGNOSIS — F902 Attention-deficit hyperactivity disorder, combined type: Secondary | ICD-10-CM

## 2019-01-14 ENCOUNTER — Other Ambulatory Visit (HOSPITAL_COMMUNITY): Payer: Self-pay | Admitting: Psychiatry

## 2019-01-15 ENCOUNTER — Ambulatory Visit (HOSPITAL_COMMUNITY): Payer: Medicare Other | Admitting: Psychiatry

## 2019-01-22 ENCOUNTER — Ambulatory Visit (INDEPENDENT_AMBULATORY_CARE_PROVIDER_SITE_OTHER): Payer: Medicare Other | Admitting: Psychiatry

## 2019-01-22 ENCOUNTER — Encounter (HOSPITAL_COMMUNITY): Payer: Self-pay | Admitting: Psychiatry

## 2019-01-22 DIAGNOSIS — F902 Attention-deficit hyperactivity disorder, combined type: Secondary | ICD-10-CM

## 2019-01-22 MED ORDER — FOCALIN XR 35 MG PO CP24: 35.0000 mg | ORAL_CAPSULE | ORAL | 0 refills | Status: DC

## 2019-01-22 MED ORDER — FOCALIN XR 35 MG PO CP24: 1.0000 | ORAL_CAPSULE | Freq: Every morning | ORAL | 0 refills | Status: DC

## 2019-01-22 MED ORDER — GUANFACINE HCL ER 4 MG PO TB24: 4.0000 mg | ORAL_TABLET | Freq: Every morning | ORAL | 2 refills | Status: DC

## 2019-01-22 NOTE — Progress Notes (Signed)
BH MD/PA/NP OP Progress Note  01/22/2019 3:25 PM Aaron Dean  MRN:  [id number]  Chief Complaint:  Chief Complaint    ADHD; Agitation; Follow-up     HPI: This patient is a 23year-old black male who lives with his mother grandmother and 2 uncles in [place]. He is taking classes at [employer]   Apparently the patient was born at 28 weeks due to placenta disruption. He was in the NICU for 4 months. He had delayed milestones and needed speech and physical therapy. 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  The patient used to have a lot of disruptive agitated behaviors but he is doing much better now on a combination of Focalin and guanfacine.  The patient returns after 3 months.  He is no longer in community college right now.  He failed his remedial math class and will have to take it again next fall.  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  He states that he is staying well focused his mood is good and he has not had any temper outbursts.  He is eating and sleeping well.  He denies being depressed and does not have any other specific complaints Visit Diagnosis:    ICD-10-CM   1. ADHD (attention deficit hyperactivity disorder), combined type F90.2 guanFACINE (INTUNIV) 4 MG TB24 ER tablet    Past Psychiatric History: Long-term outpatient treatment  Past Medical History:  Past Medical History:  Diagnosis Date  . ADHD (attention deficit hyperactivity disorder)   . Asthma    seasonal related  . Bronchitis   . Chromosomal abnormality   . Diabetes mellitus type II   . Oppositional defiant disorder   . Premature birth   . Seasonal allergies   . Sleep apnoea, obstructed     Past Surgical History:  Procedure Laterality Date  . TONSILLECTOMY AND ADENOIDECTOMY    . Tubes in ears during early childhood      Family Psychiatric History: See  below  Family History:  Family History  Problem Relation Age of Onset  . Alcohol abuse Father   . Drug abuse Father   . Autism spectrum disorder Cousin   . Seizures Paternal Uncle   . Anxiety disorder Maternal Grandmother   . ADD / ADHD Neg Hx   . Bipolar disorder Neg Hx   . Dementia Neg Hx   . Depression Neg Hx   . OCD Neg Hx   . Paranoid behavior Neg Hx   . Schizophrenia Neg Hx   . Physical abuse Neg Hx   . Sexual abuse Neg Hx     Social History:  Social History   Socioeconomic History  . Marital status: Single    Spouse name: Not on file  . Number of children: Not on file  . Years of education: Not on file  . Highest education level: Not on file  Occupational History  . Not on file  Social Needs  . Financial resource strain: Not on file  . Food insecurity:    Worry: Not on file    Inability: Not on file  . Transportation needs:    Medical: Not on file    Non-medical: Not on file  Tobacco Use  . Smoking status: Never Smoker  . Smokeless tobacco: Never Used  Substance and Sexual Activity  .  Alcohol use: No  . Drug use: No  . Sexual activity: Never  Lifestyle  . Physical activity:    Days per week: Not on file    Minutes per session: Not on file  . Stress: Not on file  Relationships  . Social connections:    Talks on phone: Not on file    Gets together: Not on file    Attends religious service: Not on file    Active member of club or organization: Not on file    Attends meetings of clubs or organizations: Not on file    Relationship status: Not on file  Other Topics Concern  . Not on file  Social History Narrative  . Not on file    Allergies:  Allergies  Allergen Reactions  . Abilify [Aripiprazole] Other (See Comments)    Altered mental state  . Amoxicillin-Pot Clavulanate Rash    Metabolic Disorder Labs: No results found for: HGBA1C, MPG No results found for: PROLACTIN No results found for: CHOL, TRIG, HDL, CHOLHDL, VLDL, LDLCALC No  results found for: TSH  Therapeutic Level Labs: No results found for: LITHIUM No results found for: VALPROATE No components found for:  CBMZ  Current Medications: Current Outpatient Medications  Medication Sig Dispense Refill  . cetirizine (ZYRTEC) 10 MG tablet Take 10 mg by mouth daily.    Marland Kitchen FOCALIN XR 35 MG CP24 Take 35 mg by mouth every morning. 30 capsule 0  . FOCALIN XR 35 MG CP24 Take 35 mg by mouth every morning. 30 capsule 0  . FOCALIN XR 35 MG CP24 Take 1 capsule by mouth every morning. 30 capsule 0  . guaiFENesin (DIABETIC TUSSIN EX) 100 MG/5ML liquid Take 200 mg by mouth once as needed. For cough/cold    . guanFACINE (INTUNIV) 4 MG TB24 ER tablet Take 1 tablet (4 mg total) by mouth every morning. 30 tablet 2  . tretinoin (RETIN-A) 0.025 % cream Apply 1 application topically daily as needed. For skin breakouts     No current facility-administered medications for this visit.      Musculoskeletal: Strength & Muscle Tone: within normal limits Gait & Station: normal Patient leans: N/A  Psychiatric Specialty Exam: Review of Systems  All other systems reviewed and are negative.   Blood pressure 112/79, pulse 80, height 5\' 6"  (1.676 m), weight 216 lb (98 kg), SpO2 98 %.Body mass index is 34.86 kg/m.  General Appearance: Casual and Fairly Groomed  Eye Contact:  Good  Speech:  Clear and Coherent  Volume:  Normal  Mood:  Euthymic  Affect:  Appropriate and Congruent  Thought Process:  Goal Directed  Orientation:  Full (Time, Place, and Person)  Thought Content: WDL   Suicidal Thoughts:  No  Homicidal Thoughts:  No  Memory:  Immediate;   Good Recent;   Good Remote;   Fair  Judgement:  Fair  Insight:  Shallow  Psychomotor Activity:  Normal  Concentration:  Concentration: Fair and Attention Span: Fair  Recall:  Good  Fund of Knowledge: Fair  Language: Good  Akathisia:  No  Handed:  Right  AIMS (if indicated): not done  Assets:  Communication Skills Desire for  Improvement Physical Health Resilience Social Support Talents/Skills  ADL's:  Intact  Cognition: Impaired,  Mild  Sleep:  Good   Screenings:   Assessment and Plan:  This patient is a 23 year old male with a history of some cognitive impairments ADHD and agitated behavior.  He continues to do well on his current  regimen.  He will continue Intuniv 4 mg daily for agitation and Focalin XR brand-name 35 mg every morning for focus.  He will return to see me in 3 months  Diannia Rudereborah Ross, MD 01/22/2019, 3:25 PM

## 2019-03-11 ENCOUNTER — Other Ambulatory Visit (HOSPITAL_COMMUNITY): Payer: Self-pay | Admitting: Psychiatry

## 2019-03-11 DIAGNOSIS — F902 Attention-deficit hyperactivity disorder, combined type: Secondary | ICD-10-CM

## 2019-04-24 ENCOUNTER — Encounter (HOSPITAL_COMMUNITY): Payer: Self-pay | Admitting: Psychiatry

## 2019-04-24 ENCOUNTER — Other Ambulatory Visit: Payer: Self-pay

## 2019-04-24 ENCOUNTER — Ambulatory Visit (INDEPENDENT_AMBULATORY_CARE_PROVIDER_SITE_OTHER): Payer: Medicare Other | Admitting: Psychiatry

## 2019-04-24 DIAGNOSIS — F902 Attention-deficit hyperactivity disorder, combined type: Secondary | ICD-10-CM | POA: Diagnosis not present

## 2019-04-24 MED ORDER — FOCALIN XR 35 MG PO CP24: 1.0000 | ORAL_CAPSULE | Freq: Every morning | ORAL | 0 refills | Status: DC

## 2019-04-24 MED ORDER — GUANFACINE HCL ER 4 MG PO TB24: 4.0000 mg | ORAL_TABLET | Freq: Every morning | ORAL | 2 refills | Status: DC

## 2019-04-24 MED ORDER — FOCALIN XR 35 MG PO CP24: 35.0000 mg | ORAL_CAPSULE | ORAL | 0 refills | Status: DC

## 2019-04-24 NOTE — Progress Notes (Signed)
Virtual Visit via Video Note  I connected with Aaron Dean on 04/24/19 at  3:00 PM EDT by a video enabled telemedicine application and verified that I am speaking with the correct person using two identifiers.   I discussed the limitations of evaluation and management by telemedicine and the availability of in person appointments. The patient expressed understanding and agreed to proceed.      I discussed the assessment and treatment plan with the patient. The patient was provided an opportunity to ask questions and all were answered. The patient agreed with the plan and demonstrated an understanding of the instructions.   The patient was advised to call back or seek an in-person evaluation if the symptoms worsen or if the condition fails to improve as anticipated.  I provided 15 minutes of non-face-to-face time during this encounter.   Aaron Rudereborah Cordarrel Stiefel, MD  [facility]  04/24/2019 2:58 PM Aaron Dean  MRN:  [id number]  Chief Complaint: ADHD HPI: This patient is a [age]year-old black male who lives with his mother grandmother and 2 uncles in [place]. [facility] is [facility] [employer] [facility]   Apparently the patient was born at 28 weeks due to placenta disruption. He was in the NICU for 4 months. He had delayed milestones and needed speech and physical therapy. 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  The patient used to have a lot of disruptive agitated behaviors but he is doing much better now on a combination of Focalin and guanfacine.  The patient returns for follow-up after 3 months.  He seen through telemedicine due to the coronavirus pandemic.  He states that right now he is not able to take classes or work due to the coronavirus.  He is just staying at home doing some housework and playing basketball on his own at his outdoor hoop.  He is still talking to cousins and friends when he  plays video games with them online.  He states that he is getting bored but he is dealing with that.  He has had no outbursts.  He is eating and sleeping well.  The Focalin XR continues to help him with focus. Visit Diagnosis:    ICD-10-CM   1. ADHD (attention deficit hyperactivity disorder), combined type F90.2 guanFACINE (INTUNIV) 4 MG TB24 ER tablet    FOCALIN XR 35 MG CP24    Past Psychiatric History: Long-term outpatient treatment  Past Medical History:  Past Medical History:  Diagnosis Date  . ADHD (attention deficit hyperactivity disorder)   . Asthma    seasonal related  . Bronchitis   . Chromosomal abnormality   . Diabetes mellitus type II   . Oppositional defiant disorder   . Premature birth   . Seasonal allergies   . Sleep apnoea, obstructed     Past Surgical History:  Procedure Laterality Date  . TONSILLECTOMY AND ADENOIDECTOMY    . Tubes in ears during early childhood      Family Psychiatric History: See below  Family History:  Family History  Problem Relation Age of Onset  . Alcohol abuse Father   . Drug abuse Father   . Autism spectrum disorder Cousin   . Seizures Paternal Uncle   . Anxiety disorder Maternal Grandmother   . ADD / ADHD Neg Hx   . Bipolar disorder Neg Hx   . Dementia Neg Hx   . Depression Neg Hx   .  OCD Neg Hx   . Paranoid behavior Neg Hx   . Schizophrenia Neg Hx   . Physical abuse Neg Hx   . Sexual abuse Neg Hx     Social History:  Social History   Socioeconomic History  . Marital status: Single    Spouse name: Not on file  . Number of children: Not on file  . Years of education: Not on file  . Highest education level: Not on file  Occupational History  . Not on file  Social Needs  . Financial resource strain: Not on file  . Food insecurity:    Worry: Not on file    Inability: Not on file  . Transportation needs:    Medical: Not on file    Non-medical: Not on file  Tobacco Use  . Smoking status: Never Smoker  .  Smokeless tobacco: Never Used  Substance and Sexual Activity  . Alcohol use: No  . Drug use: No  . Sexual activity: Never  Lifestyle  . Physical activity:    Days per week: Not on file    Minutes per session: Not on file  . Stress: Not on file  Relationships  . Social connections:    Talks on phone: Not on file    Gets together: Not on file    Attends religious service: Not on file    Active member of club or organization: Not on file    Attends meetings of clubs or organizations: Not on file    Relationship status: Not on file  Other Topics Concern  . Not on file  Social History Narrative  . Not on file    Allergies:  Allergies  Allergen Reactions  . Abilify [Aripiprazole] Other (See Comments)    Altered mental state  . Amoxicillin-Pot Clavulanate Rash    Metabolic Disorder Labs: No results found for: HGBA1C, MPG No results found for: PROLACTIN No results found for: CHOL, TRIG, HDL, CHOLHDL, VLDL, LDLCALC No results found for: TSH  Therapeutic Level Labs: No results found for: LITHIUM No results found for: VALPROATE No components found for:  CBMZ  Current Medications: Current Outpatient Medications  Medication Sig Dispense Refill  . cetirizine (ZYRTEC) 10 MG tablet Take 10 mg by mouth daily.    Marland Kitchen FOCALIN XR 35 MG CP24 Take 1 capsule by mouth every morning. 30 capsule 0  . FOCALIN XR 35 MG CP24 Take 35 mg by mouth every morning. 30 capsule 0  . FOCALIN XR 35 MG CP24 Take 35 mg by mouth every morning. 30 capsule 0  . guaiFENesin (DIABETIC TUSSIN EX) 100 MG/5ML liquid Take 200 mg by mouth once as needed. For cough/cold    . guanFACINE (INTUNIV) 4 MG TB24 ER tablet Take 1 tablet (4 mg total) by mouth every morning. 30 tablet 2  . tretinoin (RETIN-A) 0.025 % cream Apply 1 application topically daily as needed. For skin breakouts     No current facility-administered medications for this visit.      Musculoskeletal: Strength & Muscle Tone: within normal  limits Gait & Station: normal Patient leans: N/A  Psychiatric Specialty Exam: Review of Systems  All other systems reviewed and are negative.   There were no vitals taken for this visit.There is no height or weight on file to calculate BMI.  General Appearance: Casual, Neat and Well Groomed  Eye Contact:  Good  Speech:  Clear and Coherent  Volume:  Normal  Mood:  Euthymic  Affect:  Congruent  Thought Process:  Goal Directed  Orientation:  Full (Time, Place, and Person)  Thought Content: WDL   Suicidal Thoughts:  No  Homicidal Thoughts:  No  Memory:  Immediate;   Good Recent;   Good Remote;   Poor  Judgement:  Fair  Insight:  Shallow  Psychomotor Activity:  Normal  Concentration:  Concentration: Good and Attention Span: Good  Recall:  Good  Fund of Knowledge: Fair  Language: Good  Akathisia:  No  Handed:  Right  AIMS (if indicated): not done  Assets:  Communication Skills Desire for Improvement Physical Health Resilience Social Support  ADL's:  Intact  Cognition: Impaired,  Mild  Sleep:  Good   Screenings:   Assessment and Plan:  This patient is a 23 year old male with a history of learning delays ADD and a past history of temper outbursts.  He is doing quite well on his current regimen.  He will continue Focalin XR 35 mg every morning for focus and guanfacine 4 mg at bedtime for temper management.  He will return to see me in 3 months  Aaron Ruder, MD 04/24/2019, 2:58 PM

## 2019-07-10 ENCOUNTER — Other Ambulatory Visit (HOSPITAL_COMMUNITY): Payer: Self-pay | Admitting: Psychiatry

## 2019-07-10 DIAGNOSIS — F902 Attention-deficit hyperactivity disorder, combined type: Secondary | ICD-10-CM

## 2019-07-28 ENCOUNTER — Other Ambulatory Visit: Payer: Self-pay

## 2019-07-28 ENCOUNTER — Ambulatory Visit (INDEPENDENT_AMBULATORY_CARE_PROVIDER_SITE_OTHER): Payer: Medicaid Other | Admitting: Psychiatry

## 2019-07-28 ENCOUNTER — Encounter (HOSPITAL_COMMUNITY): Payer: Self-pay | Admitting: Psychiatry

## 2019-07-28 DIAGNOSIS — F902 Attention-deficit hyperactivity disorder, combined type: Secondary | ICD-10-CM | POA: Diagnosis not present

## 2019-07-28 MED ORDER — FOCALIN XR 35 MG PO CP24: 35.0000 mg | ORAL_CAPSULE | ORAL | 0 refills | Status: DC

## 2019-07-28 MED ORDER — FOCALIN XR 35 MG PO CP24: 1.0000 | ORAL_CAPSULE | Freq: Every morning | ORAL | 0 refills | Status: DC

## 2019-07-28 MED ORDER — GUANFACINE HCL ER 4 MG PO TB24: 4.0000 mg | ORAL_TABLET | Freq: Every morning | ORAL | 2 refills | Status: DC

## 2019-07-28 NOTE — Progress Notes (Signed)
Virtual Visit via Video Note  I connected with Aaron Dean on 07/28/19 at  3:00 PM EDT by a video enabled telemedicine application and verified that I am speaking with the correct person using two identifiers.   I discussed the limitations of evaluation and management by telemedicine and the availability of in person appointments. The patient expressed understanding and agreed to proceed.     I discussed the assessment and treatment plan with the patient. The patient was provided an opportunity to ask questions and all were answered. The patient agreed with the plan and demonstrated an understanding of the instructions.   The patient was advised to call back or seek an in-person evaluation if the symptoms worsen or if the condition fails to improve as anticipated.  I provided 15 minutes of non-face-to-face time during this encounter.   Aaron Spiller, MD  [facility] MD/PA/NP OP Progress Note  07/28/2019 3:18 PM Aaron Dean  MRN:  [id number]  Chief Complaint:  Chief Complaint    ADHD; Follow-up     HPI: This patient is a 23year-old black male who lives with his mother grandmother and 2 uncles in [place]. He is taking classes at [employer]   Apparently the patient was born at 28 weeks due to placenta disruption. He was in the NICU for 4 months. He had delayed milestones and needed speech and physical therapy. 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  Patient returns for follow-up after 3 months.  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  He is using safety measures and feels safe at work.  He denies any temper outbursts.  His mood has been good and he is keeping in touch with all of his family members.  He thinks he is going to return to  online classes and needs to call [employer].  He is sleeping well and he states that his blood Aaron is under good control. Visit Diagnosis:    ICD-10-CM   1. ADHD (attention deficit hyperactivity disorder), combined type  F90.2 guanFACINE (INTUNIV) 4 MG TB24 ER tablet    FOCALIN XR 35 MG CP24    Past Psychiatric History: Long-term outpatient treatment  Past Medical History:  Past Medical History:  Diagnosis Date  . ADHD (attention deficit hyperactivity disorder)   . Asthma    seasonal related  . Bronchitis   . Chromosomal abnormality   . Diabetes mellitus type II   . Oppositional defiant disorder   . Premature birth   . Seasonal allergies   . Sleep apnoea, obstructed     Past Surgical History:  Procedure Laterality Date  . TONSILLECTOMY AND ADENOIDECTOMY    . Tubes in ears during early childhood      Family Psychiatric History: see below  Family History:  Family History  Problem Relation Age of Onset  . Alcohol abuse Father   . Drug abuse Father   . Autism spectrum disorder Cousin   . Seizures Paternal Uncle   . Anxiety disorder Maternal Grandmother   . ADD / ADHD Neg Hx   . Bipolar disorder Neg Hx   . Dementia Neg Hx   . Depression Neg Hx   . OCD Neg Hx   . Paranoid behavior Neg Hx   .  Schizophrenia Neg Hx   . Physical abuse Neg Hx   . Sexual abuse Neg Hx     Social History:  Social History   Socioeconomic History  . Marital status: Single    Spouse name: Not on file  . Number of children: Not on file  . Years of education: Not on file  . Highest education level: Not on file  Occupational History  . Not on file  Social Needs  . Financial resource strain: Not on file  . Food insecurity    Worry: Not on file    Inability: Not on file  . Transportation needs    Medical: Not on file    Non-medical: Not on file  Tobacco Use  . Smoking status: Never Smoker  . Smokeless tobacco: Never Used  Substance and Sexual Activity  . Alcohol use: No  . Drug use: No   . Sexual activity: Never  Lifestyle  . Physical activity    Days per week: Not on file    Minutes per session: Not on file  . Stress: Not on file  Relationships  . Social Musicianconnections    Talks on phone: Not on file    Gets together: Not on file    Attends religious service: Not on file    Active member of club or organization: Not on file    Attends meetings of clubs or organizations: Not on file    Relationship status: Not on file  Other Topics Concern  . Not on file  Social History Narrative  . Not on file    Allergies:  Allergies  Allergen Reactions  . Abilify [Aripiprazole] Other (See Comments)    Altered mental state  . Amoxicillin-Pot Clavulanate Rash    Metabolic Disorder Labs: No results found for: HGBA1C, MPG No results found for: PROLACTIN No results found for: CHOL, TRIG, HDL, CHOLHDL, VLDL, LDLCALC No results found for: TSH  Therapeutic Level Labs: No results found for: LITHIUM No results found for: VALPROATE No components found for:  CBMZ  Current Medications: Current Outpatient Medications  Medication Sig Dispense Refill  . cetirizine (ZYRTEC) 10 MG tablet Take 10 mg by mouth daily.    Marland Kitchen. FOCALIN XR 35 MG CP24 Take 1 capsule by mouth every morning. 30 capsule 0  . FOCALIN XR 35 MG CP24 Take 35 mg by mouth every morning. 30 capsule 0  . FOCALIN XR 35 MG CP24 Take 35 mg by mouth every morning. 30 capsule 0  . guaiFENesin (DIABETIC TUSSIN EX) 100 MG/5ML liquid Take 200 mg by mouth once as needed. For cough/cold    . guanFACINE (INTUNIV) 4 MG TB24 ER tablet Take 1 tablet (4 mg total) by mouth every morning. 30 tablet 2  . tretinoin (RETIN-A) 0.025 % cream Apply 1 application topically daily as needed. For skin breakouts     No current facility-administered medications for this visit.      Musculoskeletal: Strength & Muscle Tone: within normal limits Gait & Station: normal Patient leans: N/A  Psychiatric Specialty Exam: Review of Systems  All  other systems reviewed and are negative.   There were no vitals taken for this visit.There is no height or weight on file to calculate BMI.  General Appearance: Casual and Fairly Groomed  Eye Contact:  Good  Speech:  Clear and Coherent  Volume:  Normal  Mood:  Euthymic  Affect:  Appropriate and Congruent  Thought Process:  Goal Directed  Orientation:  Full (Time, Place, and Person)  Thought Content: WDL   Suicidal Thoughts:  No  Homicidal Thoughts:  No  Memory:  Immediate;   Good Recent;   Good Remote;   NA  Judgement:  Fair  Insight:  Shallow  Psychomotor Activity:  Normal  Concentration:  Concentration: Fair and Attention Span: Fair  Recall:  [employer]  Fund of Knowledge: Fair  Language: Good  Akathisia:  No  Handed:  Right  AIMS (if indicated): not done  Assets:  Communication Skills Desire for Improvement Physical Health Resilience Social Support Talents/Skills  ADL's:  Intact  Cognition: Impaired,  Mild  Sleep:  Good   Screenings:   Assessment and Plan: This patient is a 23 year old male with a history of learning delays, ADD and a past history of temper outbursts.  He will continue Focalin XR 35 mg every morning for focus and guanfacine 4 mg at bedtime for temper management.  He will continue this regimen and return to see me in 3 months   Diannia Rudereborah Raushanah Osmundson, MD 07/28/2019, 3:18 PM

## 2019-10-09 ENCOUNTER — Other Ambulatory Visit (HOSPITAL_COMMUNITY): Payer: Self-pay | Admitting: Psychiatry

## 2019-10-09 DIAGNOSIS — F902 Attention-deficit hyperactivity disorder, combined type: Secondary | ICD-10-CM

## 2019-10-29 ENCOUNTER — Other Ambulatory Visit: Payer: Self-pay

## 2019-10-29 ENCOUNTER — Ambulatory Visit (INDEPENDENT_AMBULATORY_CARE_PROVIDER_SITE_OTHER): Payer: Medicaid Other | Admitting: Psychiatry

## 2019-10-29 ENCOUNTER — Encounter (HOSPITAL_COMMUNITY): Payer: Self-pay | Admitting: Psychiatry

## 2019-10-29 DIAGNOSIS — F902 Attention-deficit hyperactivity disorder, combined type: Secondary | ICD-10-CM | POA: Diagnosis not present

## 2019-10-29 MED ORDER — GUANFACINE HCL ER 4 MG PO TB24: 4.0000 mg | ORAL_TABLET | Freq: Every morning | ORAL | 2 refills | Status: DC

## 2019-10-29 MED ORDER — FOCALIN XR 35 MG PO CP24: 1.0000 | ORAL_CAPSULE | Freq: Every morning | ORAL | 0 refills | Status: DC

## 2019-10-29 MED ORDER — FOCALIN XR 35 MG PO CP24: 35.0000 mg | ORAL_CAPSULE | ORAL | 0 refills | Status: DC

## 2019-10-29 NOTE — Progress Notes (Signed)
Virtual Visit via Video Note  I connected with Aaron Dean on 10/29/19 at  1:20 PM EST by a video enabled telemedicine application and verified that I am speaking with the correct person using two identifiers.   I discussed the limitations of evaluation and management by telemedicine and the availability of in person appointments. The patient expressed understanding and agreed to proceed.    I discussed the assessment and treatment plan with the patient. The patient was provided an opportunity to ask questions and all were answered. The patient agreed with the plan and demonstrated an understanding of the instructions.   The patient was advised to call back or seek an in-person evaluation if the symptoms worsen or if the condition fails to improve as anticipated.  I provided 15 minutes of non-face-to-face time during this encounter.   Levonne Spiller, MD  [facility] MD/PA/NP OP Progress Note  10/29/2019 1:49 PM Aaron Dean  MRN:  [id number]  Chief Complaint:  Chief Complaint    ADHD; Follow-up     HPI: This patient is a 23year-old black male who lives with his mother grandmother and 2 uncles in [place]. He is taking classes at [employer]   Apparently the patient was born at 28 weeks due to placenta disruption. He was in the NICU for 4 months. He had delayed milestones and needed speech and physical therapy. 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  The patient returns after 3 months.  He states he is doing well.  He continues to work at [employer] doing [employer] work.  He is not going back and community college until after Christmas.  He recently saw his endocrinologist and his A1c has gone up a [place] to 6.2.  I encouraged him to try to work on getting his weight down  and manage his carbohydrates.  His mood is been good he denies depression and has not had any temper outbursts.  He is sleeping well.  He states that his medications for focus are still working well. Visit Diagnosis:    ICD-10-CM   1. ADHD (attention deficit hyperactivity disorder), combined type  F90.2 FOCALIN XR 35 MG CP24    guanFACINE (INTUNIV) 4 MG TB24 ER tablet    Past Psychiatric History: Long-term outpatient treatment  Past Medical History:  Past Medical History:  Diagnosis Date  . ADHD (attention deficit hyperactivity disorder)   . Asthma    seasonal related  . Bronchitis   . Chromosomal abnormality   . Diabetes mellitus type II   . Oppositional defiant disorder   . Premature birth   . Seasonal allergies   . Sleep apnoea, obstructed     Past Surgical History:  Procedure Laterality Date  . TONSILLECTOMY AND ADENOIDECTOMY    . Tubes in ears during early childhood      Family Psychiatric History: see below  Family History:  Family History  Problem Relation Age of Onset  . Alcohol abuse Father   . Drug abuse Father   . Autism spectrum disorder Cousin   . Seizures Paternal Uncle   . Anxiety disorder Maternal Grandmother   . ADD / ADHD Neg Hx   . Bipolar disorder Neg Hx   . Dementia Neg Hx   . Depression Neg Hx   .  OCD Neg Hx   . Paranoid behavior Neg Hx   . Schizophrenia Neg Hx   . Physical abuse Neg Hx   . Sexual abuse Neg Hx     Social History:  Social History   Socioeconomic History  . Marital status: Single    Spouse name: Not on file  . Number of children: Not on file  . Years of education: Not on file  . Highest education level: Not on file  Occupational History  . Not on file  Social Needs  . Financial resource strain: Not on file  . Food insecurity    Worry: Not on file    Inability: Not on file  . Transportation needs    Medical: Not on file    Non-medical: Not on file  Tobacco Use  . Smoking status: Never Smoker  . Smokeless  tobacco: Never Used  Substance and Sexual Activity  . Alcohol use: No  . Drug use: No  . Sexual activity: Never  Lifestyle  . Physical activity    Days per week: Not on file    Minutes per session: Not on file  . Stress: Not on file  Relationships  . Social Musician on phone: Not on file    Gets together: Not on file    Attends religious service: Not on file    Active member of club or organization: Not on file    Attends meetings of clubs or organizations: Not on file    Relationship status: Not on file  Other Topics Concern  . Not on file  Social History Narrative  . Not on file    Allergies:  Allergies  Allergen Reactions  . Abilify [Aripiprazole] Other (See Comments)    Altered mental state  . Amoxicillin-Pot Clavulanate Rash    Metabolic Disorder Labs: No results found for: HGBA1C, MPG No results found for: PROLACTIN No results found for: CHOL, TRIG, HDL, CHOLHDL, VLDL, LDLCALC No results found for: TSH  Therapeutic Level Labs: No results found for: LITHIUM No results found for: VALPROATE No components found for:  CBMZ  Current Medications: Current Outpatient Medications  Medication Sig Dispense Refill  . cetirizine (ZYRTEC) 10 MG tablet Take 10 mg by mouth daily.    Marland Kitchen FOCALIN XR 35 MG CP24 Take 1 capsule by mouth every morning. 30 capsule 0  . FOCALIN XR 35 MG CP24 Take 35 mg by mouth every morning. 30 capsule 0  . FOCALIN XR 35 MG CP24 Take 35 mg by mouth every morning. 30 capsule 0  . guaiFENesin (DIABETIC TUSSIN EX) 100 MG/5ML liquid Take 200 mg by mouth once as needed. For cough/cold    . guanFACINE (INTUNIV) 4 MG TB24 ER tablet Take 1 tablet (4 mg total) by mouth every morning. 30 tablet 2  . tretinoin (RETIN-A) 0.025 % cream Apply 1 application topically daily as needed. For skin breakouts     No current facility-administered medications for this visit.      Musculoskeletal: Strength & Muscle Tone: within normal limits Gait &  Station: normal Patient leans: N/A  Psychiatric Specialty Exam: Review of Systems  All other systems reviewed and are negative.   There were no vitals taken for this visit.There is no height or weight on file to calculate BMI.  General Appearance: Casual and Fairly Groomed  Eye Contact:  Good  Speech:  Clear and Coherent  Volume:  Normal  Mood:  Euthymic  Affect:  Appropriate and Congruent  Thought  Process:  Goal Directed  Orientation:  Full (Time, Place, and Person)  Thought Content: WDL   Suicidal Thoughts:  No  Homicidal Thoughts:  No  Memory:  Immediate;   Good Recent;   Good Remote;   Fair  Judgement:  Fair  Insight:  Fair  Psychomotor Activity:  Normal  Concentration:  Concentration: Fair and Attention Span: Fair  Recall:  [employer]  Fund of Knowledge: Fair  Language: Good  Akathisia:  No  Handed:  Right  AIMS (if indicated): not done  Assets:  Communication Skills Desire for Improvement Physical Health Resilience Social Support Talents/Skills  ADL's:  Intact  Cognition: Impaired,  Mild  Sleep:  Good   Screenings:   Assessment and Plan: This patient is a 23 year old male with a history of learning delays ADD and a past history of temper outbursts.  He continues to do well on his current regimen.  He will continue Focalin XR 35 mg every morning for focus and guanfacine 4 mg at bedtime for agitation.  He will return to see me in 3 months   Diannia Rudereborah Payslie Mccaig, MD 10/29/2019, 1:49 PM

## 2020-01-07 ENCOUNTER — Other Ambulatory Visit (HOSPITAL_COMMUNITY): Payer: Self-pay | Admitting: Psychiatry

## 2020-01-07 DIAGNOSIS — F902 Attention-deficit hyperactivity disorder, combined type: Secondary | ICD-10-CM

## 2020-02-02 ENCOUNTER — Encounter (HOSPITAL_COMMUNITY): Payer: Self-pay | Admitting: Psychiatry

## 2020-02-02 ENCOUNTER — Ambulatory Visit (INDEPENDENT_AMBULATORY_CARE_PROVIDER_SITE_OTHER): Payer: Medicare Other | Admitting: Psychiatry

## 2020-02-02 ENCOUNTER — Other Ambulatory Visit: Payer: Self-pay

## 2020-02-02 DIAGNOSIS — F902 Attention-deficit hyperactivity disorder, combined type: Secondary | ICD-10-CM | POA: Diagnosis not present

## 2020-02-02 NOTE — Progress Notes (Signed)
Virtual Visit via Video Note  I connected with Aaron Dean on 02/02/20 at  1:20 PM EST by a video enabled telemedicine application and verified that I am speaking with the correct person using two identifiers.   I discussed the limitations of evaluation and management by telemedicine and the availability of in person appointments. The patient expressed understanding and agreed to proceed.    I discussed the assessment and treatment plan with the patient. The patient was provided an opportunity to ask questions and all were answered. The patient agreed with the plan and demonstrated an understanding of the instructions.   The patient was advised to call back or seek an in-person evaluation if the symptoms worsen or if the condition fails to improve as anticipated.  I provided 15 minutes of non-face-to-face time during this encounter.   [name], MD  [facility] MD/PA/NP OP Progress Note  02/02/2020 1:36 PM VUE PAVON  MRN:  [id number]  Chief Complaint:  Chief Complaint    ADHD; Follow-up     HPI: This patient is a 24year-old black male who lives with his mother grandmother and 2 uncles in [place]. He works as a Arboriculturist at [employer]  Apparently the patient was born at 28 weeks due to placenta disruption. He was in the NICU for 4 months. He had delayed milestones and needed speech and physical therapy. 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  The patient returns for follow-up after 3 months.  For the most part he is doing well.  He continues to work at [employer] and he states that he enjoys it.  Claims that he is trying to get in shape by doing more walking and watching his carbohydrates.  He is not attending community college right now and has to reenroll.   He also claims he is going to try to work on getting his driver's license.  He states that his [employer]se is working well for his focus and he denies any temper outbursts or mood swings.  He is sleeping well.  He seems very pleasant and upbeat. Visit Diagnosis:    ICD-10-CM   1. ADHD (attention deficit hyperactivity disorder), combined type  F90.2     Past Psychiatric History: Long-term outpatient treatment  Past Medical History:  Past Medical History:  Diagnosis Date  . ADHD (attention deficit hyperactivity disorder)   . Asthma    seasonal related  . Bronchitis   . Chromosomal abnormality   . Diabetes mellitus type II   . Oppositional defiant disorder   . Premature birth   . Seasonal allergies   . Sleep apnoea, obstructed     Past Surgical History:  Procedure Laterality Date  . TONSILLECTOMY AND ADENOIDECTOMY    . Tubes in ears during early childhood      Family Psychiatric History: see below  Family History:  Family History  Problem Relation Age of Onset  . Alcohol abuse Father   . Drug abuse Father   . Autism spectrum disorder Cousin   . Seizures Paternal Uncle   . Anxiety disorder Maternal Grandmother   . ADD / ADHD Neg Hx   . Bipolar disorder Neg Hx   . Dementia Neg Hx   . Depression Neg Hx   . OCD  Neg Hx   . Paranoid behavior Neg Hx   . Schizophrenia Neg Hx   . Physical abuse Neg Hx   . Sexual abuse Neg Hx     Social History:  Social History   Socioeconomic History  . Marital status: Single    Spouse name: Not on file  . Number of children: Not on file  . Years of education: Not on file  . Highest education level: Not on file  Occupational History  . Not on file  Tobacco Use  . Smoking status: Never Smoker  . Smokeless tobacco: Never Used  Substance and Sexual Activity  . Alcohol use: No  . Drug use: No  . Sexual activity: Never  Other Topics Concern  . Not on file  Social History Narrative  . Not on file   Social Determinants of Health    Financial Resource Strain:   . Difficulty of Paying Living Expenses: Not on file  Food Insecurity:   . Worried About Programme researcher, broadcasting/film/video in the Last Year: Not on file  . Ran Out of Food in the Last Year: Not on file  Transportation Needs:   . Lack of Transportation (Medical): Not on file  . Lack of Transportation (Non-Medical): Not on file  Physical Activity:   . Days of Exercise per Week: Not on file  . Minutes of Exercise per Session: Not on file  Stress:   . Feeling of Stress : Not on file  Social Connections:   . Frequency of Communication with Friends and Family: Not on file  . Frequency of Social Gatherings with Friends and Family: Not on file  . Attends Religious Services: Not on file  . Active Member of Clubs or Organizations: Not on file  . Attends Banker Meetings: Not on file  . Marital Status: Not on file    Allergies:  Allergies  Allergen Reactions  . Abilify [Aripiprazole] Other (See Comments)    Altered mental state  . Amoxicillin-Pot Clavulanate Rash    Metabolic Disorder Labs: No results found for: HGBA1C, MPG No results found for: PROLACTIN No results found for: CHOL, TRIG, HDL, CHOLHDL, VLDL, LDLCALC No results found for: TSH  Therapeutic Level Labs: No results found for: LITHIUM No results found for: VALPROATE No components found for:  CBMZ  Current Medications: Current Outpatient Medications  Medication Sig Dispense Refill  . cetirizine (ZYRTEC) 10 MG tablet Take 10 mg by mouth daily.    Marland Kitchen FOCALIN XR 35 MG CP24 Take 1 capsule by mouth every morning. 30 capsule 0  . FOCALIN XR 35 MG CP24 Take 35 mg by mouth every morning. 30 capsule 0  . FOCALIN XR 35 MG CP24 Take 35 mg by mouth every morning. 30 capsule 0  . guaiFENesin (DIABETIC TUSSIN EX) 100 MG/5ML liquid Take 200 mg by mouth once as needed. For cough/cold    . guanFACINE (INTUNIV) 4 MG TB24 ER tablet TAKE ONE TABLET BY MOUTH EVERY MORNING 30 tablet 2  . tretinoin (RETIN-A)  0.025 % cream Apply 1 application topically daily as needed. For skin breakouts     No current facility-administered medications for this visit.     Musculoskeletal: Strength & Muscle Tone: within normal limits Gait & Station: normal Patient leans: N/A  Psychiatric Specialty Exam: Review of Systems  All other systems reviewed and are negative.   There were no vitals taken for this visit.There is no height or weight on file to calculate BMI.  General Appearance:  Casual and Fairly Groomed  Eye Contact:  Fair  Speech:  Clear and Coherent  Volume:  Normal  Mood:  Euthymic  Affect:  Appropriate and Congruent  Thought Process:  Goal Directed  Orientation:  Full (Time, Place, and Person)  Thought Content: WDL   Suicidal Thoughts:  No  Homicidal Thoughts:  No  Memory:  Immediate;   Good Recent;   Good Remote;   Fair  Judgement:  Fair  Insight:  Fair  Psychomotor Activity:  Normal  Concentration:  Concentration: Good and Attention Span: Good  Recall:  [employer] of Knowledge: Fair  Language: Good  Akathisia:  No  Handed:  Right  AIMS (if indicated): not done  Assets:  Communication Skills Desire for Improvement Physical Health Resilience Social Support Talents/Skills  ADL's:  Intact  Cognition: Impaired,  Mild  Sleep:  Good   Screenings:   Assessment and Plan: This patient is a 24 year old male with a history of learning delays ADD and a past history of temper outburst.  He continues to do well on his current regimen and he has not had any temper problems in several years.  He will continue Focalin XR 35 mg every morning for focus and guanfacine 4 mg at bedtime for agitation.  He will return to see me in  3 months   Levonne Spiller, MD 02/02/2020, 1:36 PM

## 2020-02-11 ENCOUNTER — Other Ambulatory Visit (HOSPITAL_COMMUNITY): Payer: Self-pay | Admitting: Psychiatry

## 2020-02-11 DIAGNOSIS — F902 Attention-deficit hyperactivity disorder, combined type: Secondary | ICD-10-CM

## 2020-02-11 MED ORDER — FOCALIN XR 35 MG PO CP24: 35.0000 mg | ORAL_CAPSULE | ORAL | 0 refills | Status: DC

## 2020-02-11 MED ORDER — FOCALIN XR 35 MG PO CP24: 1.0000 | ORAL_CAPSULE | Freq: Every morning | ORAL | 0 refills | Status: DC

## 2020-02-11 NOTE — Telephone Encounter (Signed)
Patient has been informed by Rx he has 0 refills on hold/file for FOCALIN XR 35 MG CP 24. Patient requesting refills stated he had appt on  02/02/20 with you

## 2020-02-11 NOTE — Telephone Encounter (Signed)
Patient aware refills sent. 

## 2020-02-11 NOTE — Telephone Encounter (Signed)
sent 

## 2020-04-06 ENCOUNTER — Other Ambulatory Visit (HOSPITAL_COMMUNITY): Payer: Self-pay | Admitting: Psychiatry

## 2020-04-06 DIAGNOSIS — F902 Attention-deficit hyperactivity disorder, combined type: Secondary | ICD-10-CM

## 2020-04-06 NOTE — Telephone Encounter (Signed)
APPT SCHEDULED FOR 05/03/2020

## 2020-05-03 ENCOUNTER — Telehealth (INDEPENDENT_AMBULATORY_CARE_PROVIDER_SITE_OTHER): Payer: Medicare Other | Admitting: Psychiatry

## 2020-05-03 ENCOUNTER — Other Ambulatory Visit: Payer: Self-pay

## 2020-05-03 ENCOUNTER — Encounter (HOSPITAL_COMMUNITY): Payer: Self-pay | Admitting: Psychiatry

## 2020-05-03 DIAGNOSIS — F902 Attention-deficit hyperactivity disorder, combined type: Secondary | ICD-10-CM | POA: Diagnosis not present

## 2020-05-03 MED ORDER — GUANFACINE HCL ER 4 MG PO TB24: 4.0000 mg | ORAL_TABLET | Freq: Every morning | ORAL | 2 refills | Status: DC

## 2020-05-03 NOTE — Progress Notes (Signed)
Virtual Visit via Video Note  I connected with Aaron Dean on 05/03/20 at  2:40 PM EDT by a video enabled telemedicine application and verified that I am speaking with the correct person using two identifiers.   I discussed the limitations of evaluation and management by telemedicine and the availability of in person appointments. The patient expressed understanding and agreed to proceed    I discussed the assessment and treatment plan with the patient. The patient was provided an opportunity to ask questions and all were answered. The patient agreed with the plan and demonstrated an understanding of the instructions.   The patient was advised to call back or seek an in-person evaluation if the symptoms worsen or if the condition fails to improve as anticipated.  I provided 15 minutes of non-face-to-face time during this encounter.   [name], MD  [facility] MD/PA/NP OP Progress Note  05/03/2020 2:54 PM Aaron Dean  MRN:  [id number]  Chief Complaint:  Chief Complaint    ADHD; Follow-up     HPI: This patient is a 24year-old black male who lives with his mother grandmother and 2 uncles in [place]. He works as a Arboriculturist at [employer]  Apparently the patient was born at 28 weeks due to placenta disruption. He was in the NICU for 4 months. He had delayed milestones and needed speech and physical therapy. 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  The patient returns for follow-up after 3 months.  For the most part he is doing okay.  He still working at [employer] and has not returned to [employer].  He states his mood is good and he is sleeping well.  He has not had any temper outbursts.  He is functioning very well at work and is able to focus well on  his current regimen. Visit Diagnosis:    ICD-10-CM   1. ADHD (attention deficit hyperactivity disorder), combined type  F90.2     Past Psychiatric History: Long-term outpatient treatment  Past Medical History:  Past Medical History:  Diagnosis Date  . ADHD (attention deficit hyperactivity disorder)   . Asthma    seasonal related  . Bronchitis   . Chromosomal abnormality   . Diabetes mellitus type II   . Oppositional defiant disorder   . Premature birth   . Seasonal allergies   . Sleep apnoea, obstructed     Past Surgical History:  Procedure Laterality Date  . TONSILLECTOMY AND ADENOIDECTOMY    . Tubes in ears during early childhood      Family Psychiatric History: See below  Family History:  Family History  Problem Relation Age of Onset  . Alcohol abuse Father   . Drug abuse Father   . Autism spectrum disorder Cousin   . Seizures Paternal Uncle   . Anxiety disorder Maternal Grandmother   . ADD / ADHD Neg Hx   . Bipolar disorder Neg Hx   . Dementia Neg Hx   . Depression Neg Hx   . OCD Neg Hx   . Paranoid behavior Neg Hx   . Schizophrenia Neg Hx   . Physical abuse Neg Hx   . Sexual abuse Neg Hx     Social History:  Social History   Socioeconomic History  .  Marital status: Single    Spouse name: Not on file  . Number of children: Not on file  . Years of education: Not on file  . Highest education level: Not on file  Occupational History  . Not on file  Tobacco Use  . Smoking status: Never Smoker  . Smokeless tobacco: Never Used  Substance and Sexual Activity  . Alcohol use: No  . Drug use: No  . Sexual activity: Never  Other Topics Concern  . Not on file  Social History Narrative  . Not on file   Social Determinants of Health   Financial Resource Strain:   . Difficulty of Paying Living Expenses:   Food Insecurity:   . Worried About Charity fundraiser in the Last Year:   . Arboriculturist in the Last Year:   Transportation Needs:   . Consulting civil engineer (Medical):   Marland Kitchen Lack of Transportation (Non-Medical):   Physical Activity:   . Days of Exercise per Week:   . Minutes of Exercise per Session:   Stress:   . Feeling of Stress :   Social Connections:   . Frequency of Communication with Friends and Family:   . Frequency of Social Gatherings with Friends and Family:   . Attends Religious Services:   . Active Member of Clubs or Organizations:   . Attends Archivist Meetings:   Marland Kitchen Marital Status:     Allergies:  Allergies  Allergen Reactions  . Abilify [Aripiprazole] Other (See Comments)    Altered mental state  . Amoxicillin-Pot Clavulanate Rash    Metabolic Disorder Labs: No results found for: HGBA1C, MPG No results found for: PROLACTIN No results found for: CHOL, TRIG, HDL, CHOLHDL, VLDL, LDLCALC No results found for: TSH  Therapeutic Level Labs: No results found for: LITHIUM No results found for: VALPROATE No components found for:  CBMZ  Current Medications: Current Outpatient Medications  Medication Sig Dispense Refill  . cetirizine (ZYRTEC) 10 MG tablet Take 10 mg by mouth daily.    Marland Kitchen FOCALIN XR 35 MG CP24 Take 1 capsule by mouth every morning. 30 capsule 0  . FOCALIN XR 35 MG CP24 Take 35 mg by mouth every morning. 30 capsule 0  . FOCALIN XR 35 MG CP24 Take 35 mg by mouth every morning. 30 capsule 0  . guaiFENesin (DIABETIC TUSSIN EX) 100 MG/5ML liquid Take 200 mg by mouth once as needed. For cough/cold    . guanFACINE (INTUNIV) 4 MG TB24 ER tablet TAKE ONE TABLET BY MOUTH EVERY MORNING 30 tablet 2  . tretinoin (RETIN-A) 0.025 % cream Apply 1 application topically daily as needed. For skin breakouts     No current facility-administered medications for this visit.     Musculoskeletal: Strength & Muscle Tone: within normal limits Gait & Station: normal Patient leans: N/A  Psychiatric Specialty Exam: Review of Systems  All other systems reviewed and are negative.   There were no  vitals taken for this visit.There is no height or weight on file to calculate BMI.  General Appearance: Casual and Fairly Groomed  Eye Contact:  Good  Speech:  Clear and Coherent  Volume:  Normal  Mood:  Euthymic  Affect:  Appropriate and Congruent  Thought Process:  Goal Directed  Orientation:  Full (Time, Place, and Person)  Thought Content: WDL   Suicidal Thoughts:  No  Homicidal Thoughts:  No  Memory:  Immediate;   Good Recent;   Fair Remote;  NA  Judgement:  Fair  Insight:  Shallow  Psychomotor Activity:  Normal  Concentration:  Concentration: Good and Attention Span: Good  Recall:  [employer] of Knowledge: Fair  Language: Good  Akathisia:  No  Handed:  Right  AIMS (if indicated): not done  Assets:  Communication Skills Desire for Improvement Physical Health Resilience Social Support Talents/Skills  ADL's:  Intact  Cognition: Impaired,  Mild  Sleep:  Good   Screenings:   Assessment and Plan: This patient is a 24 year old male with a history of learning delays ADD and a past history of temper outbursts.  He continues to do well on his current regimen.  He will continue Focalin XR 35 mg every morning for focus and guanfacine 4 mg at bedtime for agitation.  He will return to see me in 3 months   [name], MD 05/03/2020, 2:54 PM

## 2020-05-04 ENCOUNTER — Other Ambulatory Visit (HOSPITAL_COMMUNITY): Payer: Self-pay | Admitting: Psychiatry

## 2020-05-04 DIAGNOSIS — F902 Attention-deficit hyperactivity disorder, combined type: Secondary | ICD-10-CM

## 2020-05-04 MED ORDER — FOCALIN XR 35 MG PO CP24: 1.0000 | ORAL_CAPSULE | Freq: Every morning | ORAL | 0 refills | Status: DC

## 2020-05-04 MED ORDER — FOCALIN XR 35 MG PO CP24: 35.0000 mg | ORAL_CAPSULE | ORAL | 0 refills | Status: DC

## 2020-07-05 ENCOUNTER — Other Ambulatory Visit (HOSPITAL_COMMUNITY): Payer: Self-pay | Admitting: Psychiatry

## 2020-07-05 DIAGNOSIS — F902 Attention-deficit hyperactivity disorder, combined type: Secondary | ICD-10-CM

## 2020-07-28 ENCOUNTER — Telehealth (INDEPENDENT_AMBULATORY_CARE_PROVIDER_SITE_OTHER): Payer: Self-pay | Admitting: Psychiatry

## 2020-07-28 ENCOUNTER — Other Ambulatory Visit: Payer: Self-pay

## 2020-07-28 DIAGNOSIS — Z5329 Procedure and treatment not carried out because of patient's decision for other reasons: Secondary | ICD-10-CM

## 2020-07-28 DIAGNOSIS — Z91199 Patient's noncompliance with other medical treatment and regimen due to unspecified reason: Secondary | ICD-10-CM

## 2020-08-04 ENCOUNTER — Telehealth (HOSPITAL_COMMUNITY): Payer: Medicare Other | Admitting: Psychiatry

## 2020-08-09 ENCOUNTER — Other Ambulatory Visit (HOSPITAL_COMMUNITY): Payer: Self-pay | Admitting: Psychiatry

## 2020-08-09 DIAGNOSIS — F902 Attention-deficit hyperactivity disorder, combined type: Secondary | ICD-10-CM

## 2020-08-09 NOTE — Telephone Encounter (Signed)
Call for appt

## 2020-08-16 ENCOUNTER — Encounter (HOSPITAL_COMMUNITY): Payer: Self-pay | Admitting: Psychiatry

## 2020-08-16 ENCOUNTER — Other Ambulatory Visit: Payer: Self-pay

## 2020-08-16 ENCOUNTER — Telehealth (INDEPENDENT_AMBULATORY_CARE_PROVIDER_SITE_OTHER): Payer: Medicare Other | Admitting: Psychiatry

## 2020-08-16 DIAGNOSIS — F902 Attention-deficit hyperactivity disorder, combined type: Secondary | ICD-10-CM | POA: Diagnosis not present

## 2020-08-16 MED ORDER — FOCALIN XR 35 MG PO CP24: 1.0000 | ORAL_CAPSULE | Freq: Every morning | ORAL | 0 refills | Status: DC

## 2020-08-16 MED ORDER — GUANFACINE HCL ER 4 MG PO TB24: 4.0000 mg | ORAL_TABLET | Freq: Every morning | ORAL | 2 refills | Status: DC

## 2020-08-16 MED ORDER — FOCALIN XR 35 MG PO CP24
35.0000 mg | ORAL_CAPSULE | ORAL | 0 refills | Status: DC
Start: 2020-08-16 — End: 2020-10-26

## 2020-08-16 MED ORDER — FOCALIN XR 35 MG PO CP24
1.0000 | ORAL_CAPSULE | Freq: Every morning | ORAL | 0 refills | Status: DC
Start: 2020-08-16 — End: 2020-10-26

## 2020-08-16 NOTE — Progress Notes (Signed)
Virtual Visit via Telephone Note  I connected with Aaron Dean on 08/16/20 at  1:00 PM EDT by telephone and verified that I am speaking with the correct person using two identifiers.   I discussed the limitations, risks, security and privacy concerns of performing an evaluation and management service by telephone and the availability of in person appointments. I also discussed with the patient that there may be a patient responsible charge related to this service. The patient expressed understanding and agreed to proceed.    I discussed the assessment and treatment plan with the patient. The patient was provided an opportunity to ask questions and all were answered. The patient agreed with the plan and demonstrated an understanding of the instructions.   The patient was advised to call back or seek an in-person evaluation if the symptoms worsen or if the condition fails to improve as anticipated.  I provided 15 minutes of non-face-to-face time during this encounter. Location: Provider office, patient home  [name], MD  [facility] MD/PA/NP OP Progress Note  08/16/2020 1:23 PM Aaron Dean  MRN:  [id number]  Chief Complaint:  Chief Complaint    ADHD; Follow-up     HPI: This patient is a 24year-old black male who lives with his mother grandmother and 2 uncles in [place]. 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 He was in the NICU for 4 months. He had delayed milestones and needed speech and physical therapy. 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  The patient returns for follow-up after 4 months.  For the most part he is doing well.  He continues to enjoy his job.  His focus has been good  and he has had no temper outbursts or agitation.  He is sleeping well denies depression.  He is focusing well with his current regimen by his report Visit Diagnosis:    ICD-10-CM   1. ADHD (attention deficit hyperactivity disorder), combined type  F90.2 FOCALIN XR 35 MG CP24    guanFACINE (INTUNIV) 4 MG TB24 ER tablet    Past Psychiatric History: Long-term outpatient treatment  Past Medical History:  Past Medical History:  Diagnosis Date   ADHD (attention deficit hyperactivity disorder)    Asthma    seasonal related   Bronchitis    Chromosomal abnormality    Diabetes mellitus type II    Oppositional defiant disorder    Premature birth    Seasonal allergies    Sleep apnoea, obstructed     Past Surgical History:  Procedure Laterality Date   TONSILLECTOMY AND ADENOIDECTOMY     Tubes in ears during early childhood      Family Psychiatric History: see below  Family History:  Family History  Problem Relation Age of Onset   Alcohol abuse Father    Drug abuse Father    Autism spectrum disorder Cousin    Seizures Paternal Uncle    Anxiety disorder Maternal Grandmother    ADD / ADHD Neg Hx    Bipolar disorder Neg Hx    Dementia Neg Hx    Depression Neg Hx    OCD Neg Hx    Paranoid behavior Neg Hx    Schizophrenia Neg Hx  Physical abuse Neg Hx    Sexual abuse Neg Hx     Social History:  Social History   Socioeconomic History   Marital status: Single    Spouse name: Not on file   Number of children: Not on file   Years of education: Not on file   Highest education level: Not on file  Occupational History   Not on file  Tobacco Use   Smoking status: Never Smoker   Smokeless tobacco: Never Used  Substance and Sexual Activity   Alcohol use: No   Drug use: No   Sexual activity: Never  Other Topics Concern   Not on file  Social History Narrative   Not on file   Social Determinants of Health   Financial Resource Strain:     Difficulty of Paying Living Expenses: Not on file  Food Insecurity:    Worried About Running Out of Food in the Last Year: Not on file   Ran Out of Food in the Last Year: Not on file  Transportation Needs:    Lack of Transportation (Medical): Not on file   Lack of Transportation (Non-Medical): Not on file  Physical Activity:    Days of Exercise per Week: Not on file   Minutes of Exercise per Session: Not on file  Stress:    Feeling of Stress : Not on file  Social Connections:    Frequency of Communication with Friends and Family: Not on file   Frequency of Social Gatherings with Friends and Family: Not on file   Attends Religious Services: Not on file   Active Member of Clubs or Organizations: Not on file   Attends Banker Meetings: Not on file   Marital Status: Not on file    Allergies:  Allergies  Allergen Reactions   Abilify [Aripiprazole] Other (See Comments)    Altered mental state   Amoxicillin-Pot Clavulanate Rash    Metabolic Disorder Labs: No results found for: HGBA1C, MPG No results found for: PROLACTIN No results found for: CHOL, TRIG, HDL, CHOLHDL, VLDL, LDLCALC No results found for: TSH  Therapeutic Level Labs: No results found for: LITHIUM No results found for: VALPROATE No components found for:  CBMZ  Current Medications: Current Outpatient Medications  Medication Sig Dispense Refill   cetirizine (ZYRTEC) 10 MG tablet Take 10 mg by mouth daily.     FOCALIN XR 35 MG CP24 Take 1 capsule by mouth every morning. 30 capsule 0   FOCALIN XR 35 MG CP24 Take 1 capsule by mouth every morning. 30 capsule 0   FOCALIN XR 35 MG CP24 Take 35 mg by mouth every morning. 30 capsule 0   guaiFENesin (DIABETIC TUSSIN EX) 100 MG/5ML liquid Take 200 mg by mouth once as needed. For cough/cold     guanFACINE (INTUNIV) 4 MG TB24 ER tablet Take 1 tablet (4 mg total) by mouth every morning. 30 tablet 2   tretinoin (RETIN-A) 0.025 % cream  Apply 1 application topically daily as needed. For skin breakouts     No current facility-administered medications for this visit.     Musculoskeletal: Strength & Muscle Tone: within normal limits Gait & Station: normal Patient leans: N/A  Psychiatric Specialty Exam: Review of Systems  All other systems reviewed and are negative.   There were no vitals taken for this visit.There is no height or weight on file to calculate BMI.  General Appearance: NA  Eye Contact:  NA  Speech:  Clear and Coherent  Volume:  Normal  Mood:  Euthymic  Affect:  NA  Thought Process:  Goal Directed  Orientation:  Full (Time, Place, and Person)  Thought Content: WDL   Suicidal Thoughts:  No  Homicidal Thoughts:  No  Memory:  Immediate;   Good Recent;   Good Remote;   Poor  Judgement:  Fair  Insight:  Lacking  Psychomotor Activity:  Normal  Concentration:  Concentration: Good and Attention Span: Good  Recall:  Good  Fund of Knowledge: Fair  Language: Good  Akathisia:  No  Handed:  Right  AIMS (if indicated): not done  Assets:  Communication Skills Desire for Improvement Physical Health Resilience Social Support Talents/Skills  ADL's:  Intact  Cognition: Impaired,  Mild  Sleep:  Good   Screenings:   Assessment and Plan: This patient is a 24 year old male with a history of learning delays ADD and a past history of temper outbursts.  He is doing very well on his current regimen.  He will continue Focalin XR 35 mg every morning for focus and guanfacine 4 mg daily for agitation.  He will return to see me in 3 months   [name], MD 08/16/2020, 1:23 PM

## 2020-10-04 ENCOUNTER — Other Ambulatory Visit (HOSPITAL_COMMUNITY): Payer: Self-pay | Admitting: Psychiatry

## 2020-10-04 DIAGNOSIS — F902 Attention-deficit hyperactivity disorder, combined type: Secondary | ICD-10-CM

## 2020-10-26 ENCOUNTER — Encounter (HOSPITAL_COMMUNITY): Payer: Self-pay | Admitting: Psychiatry

## 2020-10-26 ENCOUNTER — Telehealth (INDEPENDENT_AMBULATORY_CARE_PROVIDER_SITE_OTHER): Payer: Medicare Other | Admitting: Psychiatry

## 2020-10-26 ENCOUNTER — Other Ambulatory Visit: Payer: Self-pay

## 2020-10-26 DIAGNOSIS — F902 Attention-deficit hyperactivity disorder, combined type: Secondary | ICD-10-CM | POA: Diagnosis not present

## 2020-10-26 MED ORDER — FOCALIN XR 35 MG PO CP24
35.0000 mg | ORAL_CAPSULE | ORAL | 0 refills | Status: DC
Start: 2020-10-26 — End: 2021-02-08

## 2020-10-26 MED ORDER — FOCALIN XR 35 MG PO CP24
1.0000 | ORAL_CAPSULE | Freq: Every morning | ORAL | 0 refills | Status: DC
Start: 2020-10-26 — End: 2021-02-08

## 2020-10-26 MED ORDER — FOCALIN XR 35 MG PO CP24: 1.0000 | ORAL_CAPSULE | Freq: Every morning | ORAL | 0 refills | Status: DC

## 2020-10-26 MED ORDER — GUANFACINE HCL ER 4 MG PO TB24: 4.0000 mg | ORAL_TABLET | Freq: Every morning | ORAL | 2 refills | Status: DC

## 2020-10-26 NOTE — Progress Notes (Signed)
Virtual Visit via Telephone Note  I connected with Aaron Dean on 10/26/20 at 11:40 AM EDT by telephone and verified that I am speaking with the correct person using two identifiers.  Location: Patient: home Provider: office   I discussed the limitations, risks, security and privacy concerns of performing an evaluation and management service by telephone and the availability of in person appointments. I also discussed with the patient that there may be a patient responsible charge related to this service. The patient expressed understanding and agreed to proceed.     I discussed the assessment and treatment plan with the patient. The patient was provided an opportunity to ask questions and all were answered. The patient agreed with the plan and demonstrated an understanding of the instructions.   The patient was advised to call back or seek an in-person evaluation if the symptoms worsen or if the condition fails to improve as anticipated.  I provided 15 minutes of non-face-to-face time during this encounter.   [name], MD  [facility] MD/PA/NP OP Progress Note  10/26/2020 11:49 AM [name]  MRN:  [id number]  Chief Complaint:  Chief Complaint    ADHD; Follow-up     HPI: This patient is a 24year-old black male who lives with his mother grandmother and 2 uncles in [place]. 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 He was in the NICU for 4 months. He had delayed milestones and needed speech and physical therapy. 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  The patient returns for follow-up after 3 months.  He continues to do well.  He recently saw endocrinology and was found to have an  A1c of 7.2 and his cholesterol was also elevated.  Hence he is now on pravastatin and Metformin and is trying to cut down his carbohydrates.  He still enjoys his job and has not yet returned to college.  He is sleeping well.  His focus is good and he has had no temper outbursts or agitation.  According to him he is focusing well. Visit Diagnosis:    ICD-10-CM   1. ADHD (attention deficit hyperactivity disorder), combined type  F90.2 FOCALIN XR 35 MG CP24    guanFACINE (INTUNIV) 4 MG TB24 ER tablet    Past Psychiatric History: Long-term outpatient treatment  Past Medical History:  Past Medical History:  Diagnosis Date  . ADHD (attention deficit hyperactivity disorder)   . Asthma    seasonal related  . Bronchitis   . Chromosomal abnormality   . Diabetes mellitus type II   . Oppositional defiant disorder   . Premature birth   . Seasonal allergies   . Sleep apnoea, obstructed     Past Surgical History:  Procedure Laterality Date  . TONSILLECTOMY AND ADENOIDECTOMY    . Tubes in ears during early childhood      Family Psychiatric History: see below  Family History:  Family History  Problem Relation Age of Onset  . Alcohol abuse Father   . Drug abuse Father   . Autism spectrum disorder Cousin   . Seizures Paternal Uncle   . Anxiety disorder Maternal Grandmother   . ADD / ADHD Neg Hx   .  Bipolar disorder Neg Hx   . Dementia Neg Hx   . Depression Neg Hx   . OCD Neg Hx   . Paranoid behavior Neg Hx   . Schizophrenia Neg Hx   . Physical abuse Neg Hx   . Sexual abuse Neg Hx     Social History:  Social History   Socioeconomic History  . Marital status: Single    Spouse name: Not on file  . Number of children: Not on file  . Years of education: Not on file  . Highest education level: Not on file  Occupational History  . Not on file  Tobacco Use  . Smoking status: Never Smoker  . Smokeless tobacco: Never Used  Substance and Sexual Activity  . Alcohol use: No  . Drug  use: No  . Sexual activity: Never  Other Topics Concern  . Not on file  Social History Narrative  . Not on file   Social Determinants of Health   Financial Resource Strain:   . Difficulty of Paying Living Expenses: Not on file  Food Insecurity:   . Worried About Programme researcher, broadcasting/film/video in the Last Year: Not on file  . Ran Out of Food in the Last Year: Not on file  Transportation Needs:   . Lack of Transportation (Medical): Not on file  . Lack of Transportation (Non-Medical): Not on file  Physical Activity:   . Days of Exercise per Week: Not on file  . Minutes of Exercise per Session: Not on file  Stress:   . Feeling of Stress : Not on file  Social Connections:   . Frequency of Communication with Friends and Family: Not on file  . Frequency of Social Gatherings with Friends and Family: Not on file  . Attends Religious Services: Not on file  . Active Member of Clubs or Organizations: Not on file  . Attends Banker Meetings: Not on file  . Marital Status: Not on file    Allergies:  Allergies  Allergen Reactions  . Abilify [Aripiprazole] Other (See Comments)    Altered mental state  . Amoxicillin-Pot Clavulanate Rash    Metabolic Disorder Labs: No results found for: HGBA1C, MPG No results found for: PROLACTIN No results found for: CHOL, TRIG, HDL, CHOLHDL, VLDL, LDLCALC No results found for: TSH  Therapeutic Level Labs: No results found for: LITHIUM No results found for: VALPROATE No components found for:  CBMZ  Current Medications: Current Outpatient Medications  Medication Sig Dispense Refill  . metFORMIN (GLUCOPHAGE-XR) 500 MG 24 hr tablet Take by mouth.    . pravastatin (PRAVACHOL) 40 MG tablet Take by mouth.    . cetirizine (ZYRTEC) 10 MG tablet Take 10 mg by mouth daily.    Marland Kitchen FOCALIN XR 35 MG CP24 Take 1 capsule by mouth every morning. 30 capsule 0  . FOCALIN XR 35 MG CP24 Take 1 capsule by mouth every morning. 30 capsule 0  . FOCALIN XR 35 MG CP24  Take 35 mg by mouth every morning. 30 capsule 0  . guaiFENesin (DIABETIC TUSSIN EX) 100 MG/5ML liquid Take 200 mg by mouth once as needed. For cough/cold    . guanFACINE (INTUNIV) 4 MG TB24 ER tablet Take 1 tablet (4 mg total) by mouth every morning. 30 tablet 2  . tretinoin (RETIN-A) 0.025 % cream Apply 1 application topically daily as needed. For skin breakouts     No current facility-administered medications for this visit.     Musculoskeletal: Strength & Muscle  Tone: within normal limits Gait & Station: normal Patient leans: N/A  Psychiatric Specialty Exam: Review of Systems  All other systems reviewed and are negative.   There were no vitals taken for this visit.There is no height or weight on file to calculate BMI.  General Appearance: NA  Eye Contact:  NA  Speech:  Clear and Coherent  Volume:  Normal  Mood:  Euthymic  Affect:  NA  Thought Process:  Goal Directed  Orientation:  Full (Time, Place, and Person)  Thought Content: WDL   Suicidal Thoughts:  No  Homicidal Thoughts:  No  Memory:  Immediate;   Good Recent;   Good Remote;   Fair  Judgement:  Good  Insight:  Fair  Psychomotor Activity:  Normal  Concentration:  Concentration: Good and Attention Span: Good  Recall:  [employer] of Knowledge: Fair  Language: Good  Akathisia:  No  Handed:  Right  AIMS (if indicated): not done  Assets:  Communication Skills Desire for Improvement Physical Health Resilience Social Support Talents/Skills  ADL's:  Intact  Cognition: Impaired,  Mild  Sleep:  Good   Screenings:   Assessment and Plan: This patient is a 24 year old male with a history of learning delays, ADD and a past history of temper outbursts.  He continues to do well on his current regimen.  He will continue Focalin XR 35 mg every morning for focus and guanfacine 4 mg daily for agitation.  He will return to see me in 3 months   [name], MD 10/26/2020, 11:49 AM

## 2020-11-01 ENCOUNTER — Telehealth (HOSPITAL_COMMUNITY): Payer: Self-pay | Admitting: Psychiatry

## 2020-11-01 NOTE — Telephone Encounter (Signed)
Called to schedule f/u appts, left vm

## 2020-12-31 ENCOUNTER — Other Ambulatory Visit (HOSPITAL_COMMUNITY): Payer: Self-pay | Admitting: Psychiatry

## 2020-12-31 DIAGNOSIS — F902 Attention-deficit hyperactivity disorder, combined type: Secondary | ICD-10-CM

## 2021-01-24 ENCOUNTER — Other Ambulatory Visit: Payer: Self-pay

## 2021-01-24 ENCOUNTER — Encounter (HOSPITAL_COMMUNITY): Payer: Medicare Other | Admitting: Psychiatry

## 2021-01-25 ENCOUNTER — Other Ambulatory Visit: Payer: Self-pay

## 2021-01-25 ENCOUNTER — Telehealth (HOSPITAL_COMMUNITY): Payer: Medicare Other | Admitting: Psychiatry

## 2021-01-26 ENCOUNTER — Telehealth (HOSPITAL_COMMUNITY): Payer: Self-pay | Admitting: Psychiatry

## 2021-01-26 NOTE — Telephone Encounter (Signed)
Called to reschedule appt due to error in typing correct number in the notes field

## 2021-02-07 ENCOUNTER — Other Ambulatory Visit (HOSPITAL_COMMUNITY): Payer: Self-pay | Admitting: Psychiatry

## 2021-02-07 ENCOUNTER — Telehealth (HOSPITAL_COMMUNITY): Payer: Self-pay | Admitting: *Deleted

## 2021-02-07 DIAGNOSIS — F902 Attention-deficit hyperactivity disorder, combined type: Secondary | ICD-10-CM

## 2021-02-07 MED ORDER — FOCALIN XR 35 MG PO CP24: 1.0000 | ORAL_CAPSULE | Freq: Every morning | ORAL | 0 refills | Status: DC

## 2021-02-07 NOTE — Telephone Encounter (Signed)
Patient have appt with provider scheduled with provider already for 02-08-2021 before message sent, noted

## 2021-02-07 NOTE — Telephone Encounter (Signed)
Per pt he is needing refills for his Focalin.

## 2021-02-07 NOTE — Telephone Encounter (Signed)
Sent he needs to r/s

## 2021-02-08 ENCOUNTER — Telehealth (INDEPENDENT_AMBULATORY_CARE_PROVIDER_SITE_OTHER): Payer: Medicare Other | Admitting: Psychiatry

## 2021-02-08 ENCOUNTER — Other Ambulatory Visit: Payer: Self-pay

## 2021-02-08 ENCOUNTER — Encounter (HOSPITAL_COMMUNITY): Payer: Self-pay | Admitting: Psychiatry

## 2021-02-08 DIAGNOSIS — F902 Attention-deficit hyperactivity disorder, combined type: Secondary | ICD-10-CM | POA: Diagnosis not present

## 2021-02-08 MED ORDER — FOCALIN XR 35 MG PO CP24: 35.0000 mg | ORAL_CAPSULE | ORAL | 0 refills | Status: DC

## 2021-02-08 MED ORDER — FOCALIN XR 35 MG PO CP24: 1.0000 | ORAL_CAPSULE | Freq: Every morning | ORAL | 0 refills | Status: DC

## 2021-02-08 MED ORDER — GUANFACINE HCL ER 4 MG PO TB24: 4.0000 mg | ORAL_TABLET | Freq: Every morning | ORAL | 2 refills | Status: DC

## 2021-02-08 NOTE — Progress Notes (Signed)
Virtual Visit via Video Note  I connected with Aaron Dean on 02/08/21 at 11:20 AM EST by a video enabled telemedicine application and verified that I am speaking with the correct person using two identifiers.  Location: Patient: home Provider: home   I discussed the limitations of evaluation and management by telemedicine and the availability of in person appointments. The patient expressed understanding and agreed to proceed    I discussed the assessment and treatment plan with the patient. The patient was provided an opportunity to ask questions and all were answered. The patient agreed with the plan and demonstrated an understanding of the instructions.   The patient was advised to call back or seek an in-person evaluation if the symptoms worsen or if the condition fails to improve as anticipated.  I provided 15 minutes of non-face-to-face time during this encounter.   Aaron Ruder, MD  [facility] MD/PA/NP OP Progress Note  02/08/2021 11:32 AM Aaron Dean  MRN:  [id number]  Chief Complaint:  Chief Complaint    ADHD; Follow-up     [id number] patient is a 25year-old black male who lives with his mother grandmother and 2 uncles in [place]. 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 He was in the NICU for 4 months. He had delayed milestones and needed speech and physical therapy. 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  The patient returns for follow-up after 3 months.  He continues to do well.  He is enjoying his job at [employer].  He is trying to exercise by running in his neighborhood.  He is followed fairly closely by endocrinology.  He states that his mood is good and he has not had any  episodes of agitation or violent behavior.  He is sleeping well. Visit Diagnosis:    ICD-10-CM   1. ADHD (attention deficit hyperactivity disorder), combined type  F90.2 FOCALIN XR 35 MG CP24    guanFACINE (INTUNIV) 4 MG TB24 ER tablet    Past Psychiatric History: Long-term outpatient treatment  Past Medical History:  Past Medical History:  Diagnosis Date  . ADHD (attention deficit hyperactivity disorder)   . Asthma    seasonal related  . Bronchitis   . Chromosomal abnormality   . Diabetes mellitus type II   . Oppositional defiant disorder   . Premature birth   . Seasonal allergies   . Sleep apnoea, obstructed     Past Surgical History:  Procedure Laterality Date  . TONSILLECTOMY AND ADENOIDECTOMY    . Tubes in ears during early childhood      Family Psychiatric History: see below  Family History:  Family History  Problem Relation Age of Onset  . Alcohol abuse Father   . Drug abuse Father   . Autism spectrum disorder Cousin   . Seizures Paternal Uncle   . Anxiety disorder Maternal Grandmother   . ADD / ADHD Neg Hx   . Bipolar disorder Neg Hx   . Dementia Neg Hx   . Depression Neg Hx   . OCD Neg Hx   . Paranoid behavior Neg Hx   . Schizophrenia Neg Hx   . Physical abuse Neg Hx   . Sexual abuse Neg  Hx     Social History:  Social History   Socioeconomic History  . Marital status: Single    Spouse name: Not on file  . Number of children: Not on file  . Years of education: Not on file  . Highest education level: Not on file  Occupational History  . Not on file  Tobacco Use  . Smoking status: Never Smoker  . Smokeless tobacco: Never Used  Substance and Sexual Activity  . Alcohol use: No  . Drug use: No  . Sexual activity: Never  Other Topics Concern  . Not on file  Social History Narrative  . Not on file   Social Determinants of Health   Financial Resource Strain: Not on file  Food Insecurity: Not on file  Transportation Needs: Not on file   Physical Activity: Not on file  Stress: Not on file  Social Connections: Not on file    Allergies:  Allergies  Allergen Reactions  . Abilify [Aripiprazole] Other (See Comments)    Altered mental state  . Amoxicillin-Pot Clavulanate Rash    Metabolic Disorder Labs: No results found for: HGBA1C, MPG No results found for: PROLACTIN No results found for: CHOL, TRIG, HDL, CHOLHDL, VLDL, LDLCALC No results found for: TSH  Therapeutic Level Labs: No results found for: LITHIUM No results found for: VALPROATE No components found for:  CBMZ  Current Medications: Current Outpatient Medications  Medication Sig Dispense Refill  . cetirizine (ZYRTEC) 10 MG tablet Take 10 mg by mouth daily.    Marland Kitchen FOCALIN XR 35 MG CP24 Take 1 capsule by mouth every morning. 30 capsule 0  . FOCALIN XR 35 MG CP24 Take 35 mg by mouth every morning. 30 capsule 0  . FOCALIN XR 35 MG CP24 Take 1 capsule by mouth every morning. 30 capsule 0  . guaiFENesin (DIABETIC TUSSIN EX) 100 MG/5ML liquid Take 200 mg by mouth once as needed. For cough/cold    . guanFACINE (INTUNIV) 4 MG TB24 ER tablet Take 1 tablet (4 mg total) by mouth every morning. 30 tablet 2  . metFORMIN (GLUCOPHAGE-XR) 500 MG 24 hr tablet Take by mouth.    . pravastatin (PRAVACHOL) 40 MG tablet Take by mouth.    . tretinoin (RETIN-A) 0.025 % cream Apply 1 application topically daily as needed. For skin breakouts     No current facility-administered medications for this visit.     Musculoskeletal: Strength & Muscle Tone: within normal limits Gait & Station: normal Patient leans: N/A  Psychiatric Specialty Exam: Review of Systems  All other systems reviewed and are negative.   There were no vitals taken for this visit.There is no height or weight on file to calculate BMI.  General Appearance: Casual and Fairly Groomed  Eye Contact:  Good  Speech:  Clear and Coherent  Volume:  Normal  Mood:  Euthymic  Affect:  Appropriate and Congruent   Thought Process:  Goal Directed  Orientation:  Full (Time, Place, and Person)  Thought Content: WDL   Suicidal Thoughts:  No  Homicidal Thoughts:  No  Memory:  Immediate;   Good Recent;   Good Remote;   Fair  Judgement:  Fair  Insight:  Shallow  Psychomotor Activity:  Normal  Concentration:  Concentration: Good and Attention Span: Good  Recall:  [employer] of Knowledge: Fair  Language: Good  Akathisia:  No  Handed:  Right  AIMS (if indicated): not done  Assets:  Communication Skills Desire for Improvement Physical Health Resilience Social  Support Talents/Skills  ADL's:  Intact  Cognition: Impaired,  Mild  Sleep:  Good   Screenings:   Assessment and Plan: This patient is a 25 year old male with a history of learning delays ADHD and a past history of temper outbursts.  He continues to do well on his current regimen.  He will continue Focalin XR 35 mg every morning for focus and guanfacine 4 mg daily for agitation.  He will return to see me in 3 months   Aaron Ruder, MD 02/08/2021, 11:32 AM

## 2021-05-05 ENCOUNTER — Encounter (HOSPITAL_COMMUNITY): Payer: Self-pay | Admitting: Psychiatry

## 2021-05-05 ENCOUNTER — Other Ambulatory Visit: Payer: Self-pay

## 2021-05-05 ENCOUNTER — Telehealth (INDEPENDENT_AMBULATORY_CARE_PROVIDER_SITE_OTHER): Payer: Medicare Other | Admitting: Psychiatry

## 2021-05-05 ENCOUNTER — Telehealth (HOSPITAL_COMMUNITY): Payer: Medicare Other | Admitting: Psychiatry

## 2021-05-05 DIAGNOSIS — F902 Attention-deficit hyperactivity disorder, combined type: Secondary | ICD-10-CM

## 2021-05-05 MED ORDER — FOCALIN XR 35 MG PO CP24: 1.0000 | ORAL_CAPSULE | Freq: Every morning | ORAL | 0 refills | Status: DC

## 2021-05-05 MED ORDER — GUANFACINE HCL ER 4 MG PO TB24: 4.0000 mg | ORAL_TABLET | Freq: Every morning | ORAL | 2 refills | Status: DC

## 2021-05-05 MED ORDER — FOCALIN XR 35 MG PO CP24: 35.0000 mg | ORAL_CAPSULE | ORAL | 0 refills | Status: DC

## 2021-05-05 NOTE — Progress Notes (Signed)
Virtual Visit via Telephone Note  I connected with Aaron Dean on 05/05/21 at  2:20 PM EDT by telephone and verified that I am speaking with the correct person using two identifiers.  Location: Patient: home Provider: office   I discussed the limitations, risks, security and privacy concerns of performing an evaluation and management service by telephone and the availability of in person appointments. I also discussed with the patient that there may be a patient responsible charge related to this service. The patient expressed understanding and agreed to proceed.     I discussed the assessment and treatment plan with the patient. The patient was provided an opportunity to ask questions and all were answered. The patient agreed with the plan and demonstrated an understanding of the instructions.   The patient was advised to call back or seek an in-person evaluation if the symptoms worsen or if the condition fails to improve as anticipated.  I provided 15 minutes of non-face-to-face time during this encounter.   [name], MD  [facility] MD/PA/NP OP Progress Note  05/05/2021 2:33 PM Aaron Dean  MRN:  [id number]  Chief Complaint:  Chief Complaint    ADHD; Follow-up     WUJ:[id number] patient is a 25year-old black male who lives with his mother grandmother and 2 uncles in [place]. 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 He was in the NICU for 4 months. He had delayed milestones and needed speech and physical therapy. 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  The patient returns for follow-up after 3 months.  He states he is doing well but nothing particularly new to report.  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  He is exercising by walking and playing basketball.  He is thinking about going back to community college.  He states his mood is good and he has not had any episodes of agitation or difficulty getting along with people.  He is focusing well on the Intuniv and Focalin Visit Diagnosis:    ICD-10-CM   1. ADHD (attention deficit hyperactivity disorder), combined type  F90.2 FOCALIN XR 35 MG CP24    guanFACINE (INTUNIV) 4 MG TB24 ER tablet    Past Psychiatric History: Long-term outpatient treatment  Past Medical History:  Past Medical History:  Diagnosis Date  . ADHD (attention deficit hyperactivity disorder)   . Asthma    seasonal related  . Bronchitis   . Chromosomal abnormality   . Diabetes mellitus type II   . Oppositional defiant disorder   . Premature birth   . Seasonal allergies   . Sleep apnoea, obstructed     Past Surgical History:  Procedure Laterality Date  . TONSILLECTOMY AND ADENOIDECTOMY    . Tubes in ears during early childhood      Family Psychiatric History: see below  Family History:  Family History  Problem Relation Age of Onset  . Alcohol abuse Father   . Drug abuse Father   . Autism spectrum disorder Cousin   . Seizures Paternal Uncle   . Anxiety disorder Maternal Grandmother   . ADD / ADHD Neg Hx   . Bipolar disorder Neg Hx   .  Dementia Neg Hx   . Depression Neg Hx   . OCD Neg Hx   . Paranoid behavior Neg Hx   . Schizophrenia Neg Hx   . Physical abuse Neg Hx   . Sexual abuse Neg Hx     Social History:  Social History   Socioeconomic History  . Marital status: Single    Spouse name: Not on file  . Number of children: Not on file  . Years of education: Not on file  . Highest education level: Not on file  Occupational History  . Not on file  Tobacco Use  . Smoking status: Never Smoker  . Smokeless tobacco: Never Used  Substance and Sexual Activity  . Alcohol use: No  . Drug use: No  .  Sexual activity: Never  Other Topics Concern  . Not on file  Social History Narrative  . Not on file   Social Determinants of Health   Financial Resource Strain: Not on file  Food Insecurity: Not on file  Transportation Needs: Not on file  Physical Activity: Not on file  Stress: Not on file  Social Connections: Not on file    Allergies:  Allergies  Allergen Reactions  . Abilify [Aripiprazole] Other (See Comments)    Altered mental state  . Amoxicillin-Pot Clavulanate Rash    Metabolic Disorder Labs: No results found for: HGBA1C, MPG No results found for: PROLACTIN No results found for: CHOL, TRIG, HDL, CHOLHDL, VLDL, LDLCALC No results found for: TSH  Therapeutic Level Labs: No results found for: LITHIUM No results found for: VALPROATE No components found for:  CBMZ  Current Medications: Current Outpatient Medications  Medication Sig Dispense Refill  . cetirizine (ZYRTEC) 10 MG tablet Take 10 mg by mouth daily.    Marland Kitchen FOCALIN XR 35 MG CP24 Take 1 capsule by mouth every morning. 30 capsule 0  . FOCALIN XR 35 MG CP24 Take 35 mg by mouth every morning. 30 capsule 0  . FOCALIN XR 35 MG CP24 Take 1 capsule by mouth every morning. 30 capsule 0  . guaiFENesin (DIABETIC TUSSIN EX) 100 MG/5ML liquid Take 200 mg by mouth once as needed. For cough/cold    . guanFACINE (INTUNIV) 4 MG TB24 ER tablet Take 1 tablet (4 mg total) by mouth every morning. 30 tablet 2  . metFORMIN (GLUCOPHAGE-XR) 500 MG 24 hr tablet Take by mouth.    . pravastatin (PRAVACHOL) 40 MG tablet Take by mouth.    . tretinoin (RETIN-A) 0.025 % cream Apply 1 application topically daily as needed. For skin breakouts     No current facility-administered medications for this visit.     Musculoskeletal: Strength & Muscle Tone: within normal limits Gait & Station: normal Patient leans: N/A  Psychiatric Specialty Exam: Review of Systems  All other systems reviewed and are negative.   There were no vitals  taken for this visit.There is no height or weight on file to calculate BMI.  General Appearance: NA  Eye Contact:  NA  Speech:  Clear and Coherent  Volume:  Normal  Mood:  Euthymic  Affect:  NA  Thought Process:  Goal Directed  Orientation:  Full (Time, Place, and Person)  Thought Content: WDL   Suicidal Thoughts:  No  Homicidal Thoughts:  No  Memory:  Immediate;   Good Recent;   Fair Remote;   NA  Judgement:  Fair  Insight:  Shallow  Psychomotor Activity:  Normal  Concentration:  Concentration: Good and Attention Span: Good  Recall:  Good  Fund of Knowledge: Fair  Language: Good  Akathisia:  No  Handed:  Right  AIMS (if indicated): not done  Assets:  Communication Skills Desire for Improvement Physical Health Resilience Social Support  ADL's:  Intact  Cognition: Impaired,  Mild  Sleep:  Good   Screenings:   Assessment and Plan: This patient is a 26 year old male with a history of learning delays ADHD and a past history of temper outbursts.  He is very stable now.  He will continue Focalin XR 35 mg every morning for focus and guanfacine 4 mg daily for agitation.  He will return to see me in 3 months   [name], MD 05/05/2021, 2:33 PM

## 2021-08-01 ENCOUNTER — Other Ambulatory Visit (HOSPITAL_COMMUNITY): Payer: Self-pay | Admitting: Psychiatry

## 2021-08-01 DIAGNOSIS — F902 Attention-deficit hyperactivity disorder, combined type: Secondary | ICD-10-CM

## 2021-08-02 ENCOUNTER — Telehealth (INDEPENDENT_AMBULATORY_CARE_PROVIDER_SITE_OTHER): Payer: Medicare Other | Admitting: Psychiatry

## 2021-08-02 ENCOUNTER — Other Ambulatory Visit: Payer: Self-pay

## 2021-08-02 ENCOUNTER — Encounter (HOSPITAL_COMMUNITY): Payer: Self-pay | Admitting: Psychiatry

## 2021-08-02 DIAGNOSIS — F902 Attention-deficit hyperactivity disorder, combined type: Secondary | ICD-10-CM

## 2021-08-02 MED ORDER — GUANFACINE HCL ER 4 MG PO TB24: 4.0000 mg | ORAL_TABLET | Freq: Every morning | ORAL | 2 refills | Status: DC

## 2021-08-02 MED ORDER — FOCALIN XR 35 MG PO CP24: 1.0000 | ORAL_CAPSULE | Freq: Every morning | ORAL | 0 refills | Status: DC

## 2021-08-02 NOTE — Progress Notes (Signed)
Virtual Visit via Video Note  I connected with Aaron Dean on 08/02/21 at 11:20 AM EDT by a video enabled telemedicine application and verified that I am speaking with the correct person using two identifiers.  Location: Patient: home Provider: office   I discussed the limitations of evaluation and management by telemedicine and the availability of in person appointments. The patient expressed understanding and agreed to proceed.     I discussed the assessment and treatment plan with the patient. The patient was provided an opportunity to ask questions and all were answered. The patient agreed with the plan and demonstrated an understanding of the instructions.   The patient was advised to call back or seek an in-person evaluation if the symptoms worsen or if the condition fails to improve as anticipated.  I provided 15 minutes of non-face-to-face time during this encounter.   Aaron Ruder, MD   [facility] MD/PA/NP OP Progress Note  08/02/2021 11:47 AM [name]  MRN:  [id number]  Chief Complaint:  Chief Complaint   ADHD; Follow-up    [name] patient is a 25 year-old black male who lives with his mother grandmother and 2 uncles in [place]. He works as a Arboriculturist at [employer]   Apparently the patient was born at 28 weeks due to placenta disruption. He was in the NICU for 4 months. He had delayed milestones and needed speech and physical therapy. 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  The patient used to have a lot of disruptive agitated behaviors but he is doing much better now on a combination of Focalin and guanfacine.   The patient returns for follow-up after 3 months.  He continues to do well.  He is still working at [employer] and enjoys it.  He is helping his mother take care of one of his uncles who is incapacitated.  He still has not gone back to [employer] but is "still  thinking about it."  He states that his mood is good he has not had any episodes of agitation or difficulty getting along with others.  He is focusing well.  He denies any side effects Visit Diagnosis:    ICD-10-CM   1. ADHD (attention deficit hyperactivity disorder), combined type  F90.2 guanFACINE (INTUNIV) 4 MG TB24 ER tablet    FOCALIN XR 35 MG CP24    FOCALIN XR 35 MG CP24      Past Psychiatric History: Long-term outpatient treatment  Past Medical History:  Past Medical History:  Diagnosis Date   ADHD (attention deficit hyperactivity disorder)    Asthma    seasonal related   Bronchitis    Chromosomal abnormality    Diabetes mellitus type II    Oppositional defiant disorder    Premature birth    Seasonal allergies    Sleep apnoea, obstructed     Past Surgical History:  Procedure Laterality Date   TONSILLECTOMY AND ADENOIDECTOMY     Tubes in ears during early childhood      Family Psychiatric History: see below  Family History:  Family History  Problem Relation Age of Onset   Alcohol abuse Father    Drug abuse Father    Autism spectrum disorder Cousin    Seizures Paternal Uncle    Anxiety disorder Maternal Grandmother    ADD / ADHD Neg Hx    Bipolar disorder Neg Hx    Dementia Neg Hx  Depression Neg Hx    OCD Neg Hx    Paranoid behavior Neg Hx    Schizophrenia Neg Hx    Physical abuse Neg Hx    Sexual abuse Neg Hx     Social History:  Social History   Socioeconomic History   Marital status: Single    Spouse name: Not on file   Number of children: Not on file   Years of education: Not on file   Highest education level: Not on file  Occupational History   Not on file  Tobacco Use   Smoking status: Never   Smokeless tobacco: Never  Substance and Sexual Activity   Alcohol use: No   Drug use: No   Sexual activity: Never  Other Topics Concern   Not on file  Social History Narrative   Not on file   Social Determinants of Health   Financial  Resource Strain: Not on file  Food Insecurity: Not on file  Transportation Needs: Not on file  Physical Activity: Not on file  Stress: Not on file  Social Connections: Not on file    Allergies:  Allergies  Allergen Reactions   Abilify [Aripiprazole] Other (See Comments)    Altered mental state   Amoxicillin-Pot Clavulanate Rash    Metabolic Disorder Labs: No results found for: HGBA1C, MPG No results found for: PROLACTIN No results found for: CHOL, TRIG, HDL, CHOLHDL, VLDL, LDLCALC No results found for: TSH  Therapeutic Level Labs: No results found for: LITHIUM No results found for: VALPROATE No components found for:  CBMZ  Current Medications: Current Outpatient Medications  Medication Sig Dispense Refill   cetirizine (ZYRTEC) 10 MG tablet Take 10 mg by mouth daily.     FOCALIN XR 35 MG CP24 Take 35 mg by mouth every morning. 30 capsule 0   FOCALIN XR 35 MG CP24 Take 1 capsule by mouth every morning. 30 capsule 0   FOCALIN XR 35 MG CP24 Take 1 capsule by mouth every morning. 30 capsule 0   FOCALIN XR 35 MG CP24 Take 1 capsule by mouth every morning. 30 capsule 0   guaiFENesin (DIABETIC TUSSIN EX) 100 MG/5ML liquid Take 200 mg by mouth once as needed. For cough/cold     guanFACINE (INTUNIV) 4 MG TB24 ER tablet Take 1 tablet (4 mg total) by mouth every morning. 30 tablet 2   metFORMIN (GLUCOPHAGE-XR) 500 MG 24 hr tablet Take by mouth.     pravastatin (PRAVACHOL) 40 MG tablet Take by mouth.     tretinoin (RETIN-A) 0.025 % cream Apply 1 application topically daily as needed. For skin breakouts     No current facility-administered medications for this visit.     Musculoskeletal: Strength & Muscle Tone: within normal limits Gait & Station: normal Patient leans: N/A  Psychiatric Specialty Exam: Review of Systems  All other systems reviewed and are negative.  There were no vitals taken for this visit.There is no height or weight on file to calculate BMI.  General  Appearance: NA  Eye Contact:  NA  Speech:  Clear and Coherent  Volume:  Normal  Mood:  Euthymic  Affect:  NA  Thought Process:  Goal Directed  Orientation:  Full (Time, Place, and Person)  Thought Content: WDL   Suicidal Thoughts:  No  Homicidal Thoughts:  No  Memory:  Immediate;   Good Recent;   Fair Remote;   NA  Judgement:  Good  Insight:  Fair  Psychomotor Activity:  Normal  Concentration:  Concentration: Good and Attention Span: Good  Recall:  Good  Fund of Knowledge: Fair  Language: Good  Akathisia:  No  Handed:  Right  AIMS (if indicated): not done  Assets:  Communication Skills Desire for Improvement Physical Health Resilience Social Support  ADL's:  Intact  Cognition: Impaired,  Mild  Sleep:  Good   Screenings:   Assessment and Plan: This patient is a 25 year old male with a history of learning delays ADHD and a past history of temper outburst.  He continues to be stable on his current regimen.  He will continue Focalin XR 35 mg every morning for focus and guanfacine 4 mg daily for focus and agitation.  He will return to see me in 3 months   Aaron Ruder, MD 08/02/2021, 11:47 AM

## 2021-09-20 NOTE — Progress Notes (Signed)
This encounter was created in error - please disregard.

## 2021-10-28 ENCOUNTER — Other Ambulatory Visit (HOSPITAL_COMMUNITY): Payer: Self-pay | Admitting: Psychiatry

## 2021-10-28 DIAGNOSIS — F902 Attention-deficit hyperactivity disorder, combined type: Secondary | ICD-10-CM

## 2021-11-03 ENCOUNTER — Telehealth (INDEPENDENT_AMBULATORY_CARE_PROVIDER_SITE_OTHER): Payer: Medicare Other | Admitting: Psychiatry

## 2021-11-03 ENCOUNTER — Other Ambulatory Visit: Payer: Self-pay

## 2021-11-03 ENCOUNTER — Encounter (HOSPITAL_COMMUNITY): Payer: Self-pay | Admitting: Psychiatry

## 2021-11-03 DIAGNOSIS — F902 Attention-deficit hyperactivity disorder, combined type: Secondary | ICD-10-CM

## 2021-11-03 MED ORDER — FOCALIN XR 35 MG PO CP24: 35.0000 mg | ORAL_CAPSULE | ORAL | 0 refills | Status: DC

## 2021-11-03 MED ORDER — GUANFACINE HCL ER 4 MG PO TB24: 4.0000 mg | ORAL_TABLET | Freq: Every morning | ORAL | 2 refills | Status: DC

## 2021-11-03 MED ORDER — FOCALIN XR 35 MG PO CP24: 1.0000 | ORAL_CAPSULE | Freq: Every morning | ORAL | 0 refills | Status: DC

## 2021-11-03 NOTE — Progress Notes (Signed)
Virtual Visit via Telephone Note  I connected with Aaron Dean on 11/03/21 at 11:20 AM EST by telephone and verified that I am speaking with the correct person using two identifiers.  Location: Patient: home Provider: office   I discussed the limitations, risks, security and privacy concerns of performing an evaluation and management service by telephone and the availability of in person appointments. I also discussed with the patient that there may be a patient responsible charge related to this service. The patient expressed understanding and agreed to proceed.     I discussed the assessment and treatment plan with the patient. The patient was provided an opportunity to ask questions and all were answered. The patient agreed with the plan and demonstrated an understanding of the instructions.   The patient was advised to call back or seek an in-person evaluation if the symptoms worsen or if the condition fails to improve as anticipated.  I provided 12 minutes of non-face-to-face time during this encounter.   Aaron Ruder, MD  [facility] MD/PA/NP OP Progress Note  11/03/2021 11:37 AM [name]  MRN:  [id number]  Chief Complaint:  Chief Complaint   ADHD; Follow-up    HPI: This patient is a 25 year-old black male who lives with his mother grandmother and 2 uncles in [place]. He works as a Arboriculturist at [employer]   Apparently the patient was born at 28 weeks due to placenta disruption. He was in the NICU for 4 months. He had delayed milestones and needed speech and physical therapy. 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  The patient used to have a lot of disruptive agitated behaviors but he is doing much better now on a combination of Focalin and guanfacine.  The patient returns for follow-up after 3 months.  He continues to do well.  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  He  has not been doing too much else other than walking daily.  He states his recent A1c was a Arrellano bit elevated and he is trying to bring it down.  His mood is good and he has not been agitated or angry he is sleeping well.  He is focusing well with his current medications and he denies any side effects Visit Diagnosis:    ICD-10-CM   1. ADHD (attention deficit hyperactivity disorder), combined type  F90.2 FOCALIN XR 35 MG CP24    FOCALIN XR 35 MG CP24    guanFACINE (INTUNIV) 4 MG TB24 ER tablet      Past Psychiatric History: Long-term outpatient treatment  Past Medical History:  Past Medical History:  Diagnosis Date   ADHD (attention deficit hyperactivity disorder)    Asthma    seasonal related   Bronchitis    Chromosomal abnormality    Diabetes mellitus type II    Oppositional defiant disorder    Premature birth    Seasonal allergies    Sleep apnoea, obstructed     Past Surgical History:  Procedure Laterality Date   TONSILLECTOMY AND ADENOIDECTOMY     Tubes in ears during early childhood      Family Psychiatric History: see below  Family History:  Family History  Problem Relation Age of Onset   Alcohol abuse Father    Drug abuse Father    Autism spectrum disorder Cousin    Seizures Paternal Uncle    Anxiety disorder Maternal  Grandmother    ADD / ADHD Neg Hx    Bipolar disorder Neg Hx    Dementia Neg Hx    Depression Neg Hx    OCD Neg Hx    Paranoid behavior Neg Hx    Schizophrenia Neg Hx    Physical abuse Neg Hx    Sexual abuse Neg Hx     Social History:  Social History   Socioeconomic History   Marital status: Single    Spouse name: Not on file   Number of children: Not on file   Years of education: Not on file   Highest education level: Not on file  Occupational History   Not on file  Tobacco Use   Smoking status: Never   Smokeless tobacco: Never  Substance and Sexual Activity   Alcohol use: No   Drug use: No   Sexual activity: Never  Other  Topics Concern   Not on file  Social History Narrative   Not on file   Social Determinants of Health   Financial Resource Strain: Not on file  Food Insecurity: Not on file  Transportation Needs: Not on file  Physical Activity: Not on file  Stress: Not on file  Social Connections: Not on file    Allergies:  Allergies  Allergen Reactions   Abilify [Aripiprazole] Other (See Comments)    Altered mental state   Amoxicillin-Pot Clavulanate Rash    Metabolic Disorder Labs: No results found for: HGBA1C, MPG No results found for: PROLACTIN No results found for: CHOL, TRIG, HDL, CHOLHDL, VLDL, LDLCALC No results found for: TSH  Therapeutic Level Labs: No results found for: LITHIUM No results found for: VALPROATE No components found for:  CBMZ  Current Medications: Current Outpatient Medications  Medication Sig Dispense Refill   cetirizine (ZYRTEC) 10 MG tablet Take 10 mg by mouth daily.     FOCALIN XR 35 MG CP24 Take 1 capsule by mouth every morning. 30 capsule 0   FOCALIN XR 35 MG CP24 Take 1 capsule by mouth every morning. 30 capsule 0   FOCALIN XR 35 MG CP24 Take 35 mg by mouth every morning. 30 capsule 0   guaiFENesin (DIABETIC TUSSIN EX) 100 MG/5ML liquid Take 200 mg by mouth once as needed. For cough/cold     guanFACINE (INTUNIV) 4 MG TB24 ER tablet Take 1 tablet (4 mg total) by mouth every morning. 30 tablet 2   metFORMIN (GLUCOPHAGE-XR) 500 MG 24 hr tablet Take by mouth.     pravastatin (PRAVACHOL) 40 MG tablet Take by mouth.     tretinoin (RETIN-A) 0.025 % cream Apply 1 application topically daily as needed. For skin breakouts     No current facility-administered medications for this visit.     Musculoskeletal: Strength & Muscle Tone: na Gait & Station: na Patient leans: N/A  Psychiatric Specialty Exam: Review of Systems  All other systems reviewed and are negative.  There were no vitals taken for this visit.There is no height or weight on file to calculate  BMI.  General Appearance: NA  Eye Contact:  NA  Speech:  Clear and Coherent  Volume:  Normal  Mood:  Euthymic  Affect:  NA  Thought Process:  Goal Directed  Orientation:  Full (Time, Place, and Person)  Thought Content: WDL   Suicidal Thoughts:  No  Homicidal Thoughts:  No  Memory:  Immediate;   Good Recent;   Good Remote;   NA  Judgement:  Good  Insight:  Fair  Psychomotor  Activity:  Normal  Concentration:  Concentration: Good and Attention Span: Good  Recall:  Good  Fund of Knowledge: Fair  Language: Good  Akathisia:  No  Handed:  Right  AIMS (if indicated): not done  Assets:  Communication Skills Desire for Improvement Physical Health Resilience Social Support Talents/Skills  ADL's:  Intact  Cognition: Impaired,  Mild  Sleep:  Good   Screenings:   Assessment and Plan: This patient is a 25 year old male with a history of learning delays ADHD and past history of temper outburst.  He is very stable on his current regimen.  He will continue Focalin XR 35 mg every morning for focus and guanfacine 4 mg daily for focus and agitation.  He will return to see me in 3 months   Aaron Ruder, MD 11/03/2021, 11:37 AM

## 2022-01-27 ENCOUNTER — Other Ambulatory Visit (HOSPITAL_COMMUNITY): Payer: Self-pay | Admitting: Psychiatry

## 2022-01-27 DIAGNOSIS — F902 Attention-deficit hyperactivity disorder, combined type: Secondary | ICD-10-CM

## 2022-01-31 ENCOUNTER — Telehealth (INDEPENDENT_AMBULATORY_CARE_PROVIDER_SITE_OTHER): Payer: Medicare Other | Admitting: Psychiatry

## 2022-01-31 ENCOUNTER — Encounter (HOSPITAL_COMMUNITY): Payer: Self-pay | Admitting: Psychiatry

## 2022-01-31 ENCOUNTER — Other Ambulatory Visit: Payer: Self-pay

## 2022-01-31 DIAGNOSIS — F902 Attention-deficit hyperactivity disorder, combined type: Secondary | ICD-10-CM

## 2022-01-31 MED ORDER — FOCALIN XR 35 MG PO CP24: 1.0000 | ORAL_CAPSULE | Freq: Every morning | ORAL | 0 refills | Status: DC

## 2022-01-31 MED ORDER — FOCALIN XR 35 MG PO CP24: 35.0000 mg | ORAL_CAPSULE | ORAL | 0 refills | Status: DC

## 2022-01-31 MED ORDER — GUANFACINE HCL ER 4 MG PO TB24: 4.0000 mg | ORAL_TABLET | Freq: Every morning | ORAL | 2 refills | Status: DC

## 2022-01-31 NOTE — Progress Notes (Signed)
Virtual Visit via Telephone Note  I connected with Aaron Dean on 01/31/22 at 10:40 AM EST by telephone and verified that I am speaking with the correct person using two identifiers.  Location: Patient: home Provider: office   I discussed the limitations, risks, security and privacy concerns of performing an evaluation and management service by telephone and the availability of in person appointments. I also discussed with the patient that there may be a patient responsible charge related to this service. The patient expressed understanding and agreed to proceed.    I discussed the assessment and treatment plan with the patient. The patient was provided an opportunity to ask questions and all were answered. The patient agreed with the plan and demonstrated an understanding of the instructions.   The patient was advised to call back or seek an in-person evaluation if the symptoms worsen or if the condition fails to improve as anticipated.  I provided 15 minutes of non-face-to-face time during this encounter.   [name], MD  [facility] MD/PA/NP OP Progress Note  01/31/2022 10:52 AM Aaron Dean  MRN:  [id number]  Chief Complaint:  Chief Complaint   ADHD; Agitation; Follow-up    HPI: This patient is a 26year-old black male who lives with his mother grandmother and 2 uncles in [place]. He works as a Arboriculturist at [employer]   Apparently the patient was born at 28 weeks due to placenta disruption. He was in the NICU for 4 months. He had delayed milestones and needed speech and physical therapy. 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  The patient used to have a lot of disruptive agitated behaviors but he is doing much better now on a combination of Focalin and guanfacine.  The patient returns for follow-up after 3 months.  He states that nothing much is changed.  Still enjoying his job at [employer].   He has been walking more trying to lose weight and get more exercise.  His mood has been stable.  He is doing well focused.  He denies any temper outbursts and he is sleeping well. Visit Diagnosis:    ICD-10-CM   1. ADHD (attention deficit hyperactivity disorder), combined type  F90.2 FOCALIN XR 35 MG CP24    FOCALIN XR 35 MG CP24    guanFACINE (INTUNIV) 4 MG TB24 ER tablet      Past Psychiatric History: Long-term outpatient treatment  Past Medical History:  Past Medical History:  Diagnosis Date   ADHD (attention deficit hyperactivity disorder)    Asthma    seasonal related   Bronchitis    Chromosomal abnormality    Diabetes mellitus type II    Oppositional defiant disorder    Premature birth    Seasonal allergies    Sleep apnoea, obstructed     Past Surgical History:  Procedure Laterality Date   TONSILLECTOMY AND ADENOIDECTOMY     Tubes in ears during early childhood      Family Psychiatric History: see below  Family History:  Family History  Problem Relation Age of Onset   Alcohol abuse Father    Drug abuse Father    Autism spectrum disorder Cousin    Seizures Paternal Uncle    Anxiety disorder Maternal Grandmother    ADD / ADHD Neg Hx    Bipolar disorder Neg Hx    Dementia Neg Hx    Depression Neg Hx  OCD Neg Hx    Paranoid behavior Neg Hx    Schizophrenia Neg Hx    Physical abuse Neg Hx    Sexual abuse Neg Hx     Social History:  Social History   Socioeconomic History   Marital status: Single    Spouse name: Not on file   Number of children: Not on file   Years of education: Not on file   Highest education level: Not on file  Occupational History   Not on file  Tobacco Use   Smoking status: Never   Smokeless tobacco: Never  Substance and Sexual Activity   Alcohol use: No   Drug use: No   Sexual activity: Never  Other Topics Concern   Not on file  Social History Narrative   Not on file   Social Determinants of Health   Financial  Resource Strain: Not on file  Food Insecurity: Not on file  Transportation Needs: Not on file  Physical Activity: Not on file  Stress: Not on file  Social Connections: Not on file    Allergies:  Allergies  Allergen Reactions   Abilify [Aripiprazole] Other (See Comments)    Altered mental state   Amoxicillin-Pot Clavulanate Rash    Metabolic Disorder Labs: No results found for: HGBA1C, MPG No results found for: PROLACTIN No results found for: CHOL, TRIG, HDL, CHOLHDL, VLDL, LDLCALC No results found for: TSH  Therapeutic Level Labs: No results found for: LITHIUM No results found for: VALPROATE No components found for:  CBMZ  Current Medications: Current Outpatient Medications  Medication Sig Dispense Refill   cetirizine (ZYRTEC) 10 MG tablet Take 10 mg by mouth daily.     FOCALIN XR 35 MG CP24 Take 1 capsule by mouth every morning. 30 capsule 0   FOCALIN XR 35 MG CP24 Take 1 capsule by mouth every morning. 30 capsule 0   FOCALIN XR 35 MG CP24 Take 35 mg by mouth every morning. 30 capsule 0   guaiFENesin (DIABETIC TUSSIN EX) 100 MG/5ML liquid Take 200 mg by mouth once as needed. For cough/cold     guanFACINE (INTUNIV) 4 MG TB24 ER tablet Take 1 tablet (4 mg total) by mouth every morning. 30 tablet 2   metFORMIN (GLUCOPHAGE-XR) 500 MG 24 hr tablet Take by mouth.     pravastatin (PRAVACHOL) 40 MG tablet Take by mouth.     tretinoin (RETIN-A) 0.025 % cream Apply 1 application topically daily as needed. For skin breakouts     No current facility-administered medications for this visit.     Musculoskeletal: Strength & Muscle Tone: na Gait & Station: na Patient leans: N/A  Psychiatric Specialty Exam: Review of Systems  All other systems reviewed and are negative.  There were no vitals taken for this visit.There is no height or weight on file to calculate BMI.  General Appearance: NA  Eye Contact:  NA  Speech:  Clear and Coherent  Volume:  Normal  Mood:  Euthymic   Affect:  NA  Thought Process:  Goal Directed  Orientation:  Full (Time, Place, and Person)  Thought Content: WDL   Suicidal Thoughts:  No  Homicidal Thoughts:  No  Memory:  Immediate;   Good Recent;   Good Remote;   NA  Judgement:  Fair  Insight:  Shallow  Psychomotor Activity:  Normal  Concentration:  Concentration: Good and Attention Span: Good  Recall:  Good  Fund of Knowledge: Fair  Language: Good  Akathisia:  No  Handed:  Right  AIMS (if indicated): not done  Assets:  Communication Skills Desire for Improvement Physical Health Resilience Social Support  ADL's:  Intact  Cognition: Impaired,  Mild  Sleep:  Good   Screenings:   Assessment and Plan: This patient is a 25 year old male with a history of learning delays ADHD and past history of temper outburst.  He continues to be very stable on his current regimen.  He will continue Focalin XR 35 mg every morning for focus and guanfacine 4 mg daily for focus and agitation.  He will return to see me in 3 months   [name], MD 01/31/2022, 10:52 AM

## 2022-04-26 ENCOUNTER — Telehealth (INDEPENDENT_AMBULATORY_CARE_PROVIDER_SITE_OTHER): Payer: Medicare Other | Admitting: Psychiatry

## 2022-04-26 ENCOUNTER — Encounter (HOSPITAL_COMMUNITY): Payer: Self-pay | Admitting: Psychiatry

## 2022-04-26 DIAGNOSIS — F902 Attention-deficit hyperactivity disorder, combined type: Secondary | ICD-10-CM | POA: Diagnosis not present

## 2022-04-26 MED ORDER — GUANFACINE HCL ER 4 MG PO TB24: 4.0000 mg | ORAL_TABLET | Freq: Every morning | ORAL | 2 refills | Status: DC

## 2022-04-26 MED ORDER — FOCALIN XR 35 MG PO CP24: 1.0000 | ORAL_CAPSULE | Freq: Every morning | ORAL | 0 refills | Status: DC

## 2022-04-26 MED ORDER — FOCALIN XR 35 MG PO CP24: 35.0000 mg | ORAL_CAPSULE | ORAL | 0 refills | Status: DC

## 2022-04-26 NOTE — Progress Notes (Signed)
Virtual Visit via Telephone Note ? ?I connected with [place] on 04/26/22 at 11:20 AM EDT by telephone and verified that I am speaking with the correct person using two identifiers. ? ?Location: ?Patient: home ?Provider: office ?  ?I discussed the limitations, risks, security and privacy concerns of performing an evaluation and management service by telephone and the availability of in person appointments. I also discussed with the patient that there may be a patient responsible charge related to this service. The patient expressed understanding and agreed to proceed. ? ? ? ?  ?I discussed the assessment and treatment plan with the patient. The patient was provided an opportunity to ask questions and all were answered. The patient agreed with the plan and demonstrated an understanding of the instructions. ?  ?The patient was advised to call back or seek an in-person evaluation if the symptoms worsen or if the condition fails to improve as anticipated. ? ?I provided 12 minutes of non-face-to-face time during this encounter. ? ? ?Levonne Spiller, MD ? ?[facility] MD/PA/NP OP Progress Note ? ?04/26/2022 11:40 AM ?Phyllis L Murnane  ?MRN:  [id number] ? ?Chief Complaint:  ?Chief Complaint  ?Patient presents with  ? ADHD  ? Follow-up  ? ?HPI: This patient is a 26year-old black male who lives with his mother grandmother and 2 uncles in [place]. He works as a Sports coach at [employer] ?  ?Apparently the patient was born at 28 weeks due to placenta disruption. He was in the NICU for 4 months. He had delayed milestones and needed speech and physical therapy. 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  The patient used to have a lot of disruptive agitated behaviors but he is doing much better now on a combination of Focalin and guanfacine. ? ?The patient returns for follow-up after 3 months.  He continues to do well.  He still enjoys his job at [employer].  He denies any other difficulties with focus attention span distractibility or temper problems.  He is getting along well with his family.  He is working on trying to lose weight. ?Visit Diagnosis:  ?  ICD-10-CM   ?1. ADHD (attention deficit hyperactivity disorder), combined type  F90.2 FOCALIN XR 35 MG CP24  ?  FOCALIN XR 35 MG CP24  ?  guanFACINE (INTUNIV) 4 MG TB24 ER tablet  ?  ? ? ?Past Psychiatric History: Long-term outpatient treatment ? ?Past Medical History:  ?Past Medical History:  ?Diagnosis Date  ? ADHD (attention deficit hyperactivity disorder)   ? Asthma   ? seasonal related  ? Bronchitis   ? Chromosomal abnormality   ? Diabetes mellitus type II   ? Oppositional defiant disorder   ? Premature birth   ? Seasonal allergies   ? Sleep apnoea, obstructed   ?  ?Past Surgical History:  ?Procedure Laterality Date  ? TONSILLECTOMY AND ADENOIDECTOMY    ? Tubes in ears during early childhood    ? ? ?Family Psychiatric History: see below ? ?Family History:  ?Family History  ?Problem Relation Age of Onset  ? Alcohol abuse Father   ? Drug abuse Father   ? Autism spectrum disorder Cousin   ? Seizures Paternal Uncle   ? Anxiety disorder Maternal Grandmother   ? ADD / ADHD Neg Hx   ? Bipolar disorder Neg Hx   ? Dementia Neg Hx   ? Depression Neg Hx   ?  OCD Neg Hx   ? Paranoid behavior Neg Hx   ? Schizophrenia Neg Hx   ? Physical abuse Neg Hx   ? Sexual abuse Neg Hx   ? ? ?Social History:  ?Social History  ? ?Socioeconomic History  ? Marital status: Single  ?  Spouse name: Not on file  ? Number of children: Not on file  ? Years of education: Not on file  ? Highest education level: Not on file  ?Occupational History  ? Not on file  ?Tobacco Use  ? Smoking status: Never  ? Smokeless tobacco: Never  ?Substance and Sexual Activity  ? Alcohol use: No  ? Drug use: No  ? Sexual activity: Never  ?Other Topics Concern  ? Not on file  ?Social History Narrative  ? Not on file  ? ?Social Determinants of Health  ? ?Financial  Resource Strain: Not on file  ?Food Insecurity: Not on file  ?Transportation Needs: Not on file  ?Physical Activity: Not on file  ?Stress: Not on file  ?Social Connections: Not on file  ? ? ?Allergies:  ?Allergies  ?Allergen Reactions  ? Abilify [Aripiprazole] Other (See Comments)  ?  Altered mental state  ? Amoxicillin-Pot Clavulanate Rash  ? ? ?Metabolic Disorder Labs: ?No results found for: HGBA1C, MPG ?No results found for: PROLACTIN ?No results found for: CHOL, TRIG, HDL, CHOLHDL, VLDL, LDLCALC ?No results found for: TSH ? ?Therapeutic Level Labs: ?No results found for: LITHIUM ?No results found for: VALPROATE ?No components found for:  CBMZ ? ?Current Medications: ?Current Outpatient Medications  ?Medication Sig Dispense Refill  ? cetirizine (ZYRTEC) 10 MG tablet Take 10 mg by mouth daily.    ? FOCALIN XR 35 MG CP24 Take 1 capsule by mouth every morning. 30 capsule 0  ? FOCALIN XR 35 MG CP24 Take 1 capsule by mouth every morning. 30 capsule 0  ? FOCALIN XR 35 MG CP24 Take 35 mg by mouth every morning. 30 capsule 0  ? guaiFENesin (DIABETIC TUSSIN EX) 100 MG/5ML liquid Take 200 mg by mouth once as needed. For cough/cold    ? guanFACINE (INTUNIV) 4 MG TB24 ER tablet Take 1 tablet (4 mg total) by mouth every morning. 30 tablet 2  ? metFORMIN (GLUCOPHAGE-XR) 500 MG 24 hr tablet Take by mouth.    ? pravastatin (PRAVACHOL) 40 MG tablet Take by mouth.    ? tretinoin (RETIN-A) 0.025 % cream Apply 1 application topically daily as needed. For skin breakouts    ? ?No current facility-administered medications for this visit.  ? ? ? ?Musculoskeletal: ?Strength & Muscle Tone: na ?Gait & Station: na ?Patient leans: N/A ? ?Psychiatric Specialty Exam: ?Review of Systems  ?All other systems reviewed and are negative.  ?There were no vitals taken for this visit.There is no height or weight on file to calculate BMI.  ?General Appearance: NA  ?Eye Contact:  NA  ?Speech:  Clear and Coherent  ?Volume:  Normal  ?Mood:  Euthymic   ?Affect:  NA  ?Thought Process:  Goal Directed  ?Orientation:  Full (Time, Place, and Person)  ?Thought Content: WDL   ?Suicidal Thoughts:  No  ?Homicidal Thoughts:  No  ?Memory:  Immediate;   Good ?Recent;   Fair ?Remote;   NA  ?Judgement:  Fair  ?Insight:  Shallow  ?Psychomotor Activity:  Normal  ?Concentration:  Concentration: Good and Attention Span: Good  ?Recall:  Good  ?Fund of Knowledge: Fair  ?Language: Good  ?Akathisia:  No  ?Handed:  Right  ?AIMS (if indicated): not done  ?Assets:  Communication Skills ?Desire for Improvement ?Physical Health ?Resilience ?Social Support ?Talents/Skills  ?ADL's:  Intact  ?Cognition: Impaired,  Mild  ?Sleep:  Good  ? ?Screenings: ? ? ?Assessment and Plan: This patient is a 26 year old male with a history of learning delays ADHD and past history of temper outbursts.  He is very stable on his current regimen.  He will continue Focalin Exar 35 mg every morning for focus and guanfacine 4 mg daily for focus and agitation.  He will return to see me in 3 months ? ?Collaboration of Care: Collaboration of Care: Primary Care Provider AEB chart notes will be released to PCP at patient's request ? ?Patient/Guardian was advised Release of Information must be obtained prior to any record release in order to collaborate their care with an outside provider. Patient/Guardian was advised if they have not already done so to contact the registration department to sign all necessary forms in order for [place] to release information regarding their care.  ? ?Consent: Patient/Guardian gives verbal consent for treatment and assignment of benefits for services provided during this visit. Patient/Guardian expressed understanding and agreed to proceed.  ? ? ?Levonne Spiller, MD ?04/26/2022, 11:40 AM ? ?

## 2022-06-26 ENCOUNTER — Other Ambulatory Visit (HOSPITAL_COMMUNITY): Payer: Self-pay | Admitting: Psychiatry

## 2022-06-26 DIAGNOSIS — F902 Attention-deficit hyperactivity disorder, combined type: Secondary | ICD-10-CM

## 2022-07-27 ENCOUNTER — Telehealth (INDEPENDENT_AMBULATORY_CARE_PROVIDER_SITE_OTHER): Payer: Medicare Other | Admitting: Psychiatry

## 2022-07-27 ENCOUNTER — Encounter (HOSPITAL_COMMUNITY): Payer: Self-pay | Admitting: Psychiatry

## 2022-07-27 DIAGNOSIS — F902 Attention-deficit hyperactivity disorder, combined type: Secondary | ICD-10-CM

## 2022-07-27 MED ORDER — FOCALIN XR 35 MG PO CP24: 35.0000 mg | ORAL_CAPSULE | ORAL | 0 refills | Status: DC

## 2022-07-27 MED ORDER — FOCALIN XR 35 MG PO CP24: 1.0000 | ORAL_CAPSULE | Freq: Every morning | ORAL | 0 refills | Status: DC

## 2022-07-27 MED ORDER — GUANFACINE HCL ER 4 MG PO TB24: 4.0000 mg | ORAL_TABLET | Freq: Every morning | ORAL | 2 refills | Status: DC

## 2022-07-27 NOTE — Progress Notes (Signed)
Virtual Visit via Telephone Note  I connected with Aaron Dean on 07/27/22 at  3:00 PM EDT by telephone and verified that I am speaking with the correct person using two identifiers.  Location: Patient: home Provider: office   I discussed the limitations, risks, security and privacy concerns of performing an evaluation and management service by telephone and the availability of in person appointments. I also discussed with the patient that there may be a patient responsible charge related to this service. The patient expressed understanding and agreed to proceed.     I discussed the assessment and treatment plan with the patient. The patient was provided an opportunity to ask questions and all were answered. The patient agreed with the plan and demonstrated an understanding of the instructions.   The patient was advised to call back or seek an in-person evaluation if the symptoms worsen or if the condition fails to improve as anticipated.  I provided 15 minutes of non-face-to-face time during this encounter.   [name], MD  [facility] MD/PA/NP OP Progress Note  07/27/2022 3:10 PM Aaron Dean  MRN:  [id number]  Chief Complaint:  Chief Complaint  Patient presents with   ADHD   Follow-up   HPI: This patient is a 26year-old black male who lives with his mother grandmother and 2 uncles in [place]. He works as a Arboriculturist at [employer]   Apparently the patient was born at 28 weeks due to placenta disruption. He was in the NICU for 4 months. He had delayed milestones and needed speech and physical therapy. 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  The patient used to have a lot of disruptive agitated behaviors but he is doing much better now on a combination of Focalin and guanfacine.  The patient returns for follow-up after 3 months.  He states things are going about the same.  He still enjoying  his job.  He had a fall at work back in May when he fell on a wet floor but he has been okay ever since.  His x-rays and head CT were normal.  He denies any complaints right now.  He states his mood is good he is focusing well with his current regimen and not had any mood swings anger or irritability. Visit Diagnosis:    ICD-10-CM   1. ADHD (attention deficit hyperactivity disorder), combined type  F90.2 FOCALIN XR 35 MG CP24    FOCALIN XR 35 MG CP24    guanFACINE (INTUNIV) 4 MG TB24 ER tablet      Past Psychiatric History: Long-term outpatient treatment  Past Medical History:  Past Medical History:  Diagnosis Date   ADHD (attention deficit hyperactivity disorder)    Asthma    seasonal related   Bronchitis    Chromosomal abnormality    Diabetes mellitus type II    Oppositional defiant disorder    Premature birth    Seasonal allergies    Sleep apnoea, obstructed     Past Surgical History:  Procedure Laterality Date   TONSILLECTOMY AND ADENOIDECTOMY     Tubes in ears during early childhood      Family Psychiatric History: See below  Family History:  Family History  Problem Relation Age of Onset   Alcohol abuse Father    Drug abuse Father    Autism spectrum disorder Cousin    Seizures Paternal Uncle    Anxiety disorder  Maternal Grandmother    ADD / ADHD Neg Hx    Bipolar disorder Neg Hx    Dementia Neg Hx    Depression Neg Hx    OCD Neg Hx    Paranoid behavior Neg Hx    Schizophrenia Neg Hx    Physical abuse Neg Hx    Sexual abuse Neg Hx     Social History:  Social History   Socioeconomic History   Marital status: Single    Spouse name: Not on file   Number of children: Not on file   Years of education: Not on file   Highest education level: Not on file  Occupational History   Not on file  Tobacco Use   Smoking status: Never   Smokeless tobacco: Never  Substance and Sexual Activity   Alcohol use: No   Drug use: No   Sexual activity: Never  Other  Topics Concern   Not on file  Social History Narrative   Not on file   Social Determinants of Health   Financial Resource Strain: Not on file  Food Insecurity: Not on file  Transportation Needs: Not on file  Physical Activity: Not on file  Stress: Not on file  Social Connections: Not on file    Allergies:  Allergies  Allergen Reactions   Abilify [Aripiprazole] Other (See Comments)    Altered mental state   Amoxicillin-Pot Clavulanate Rash    Metabolic Disorder Labs: No results found for: "HGBA1C", "MPG" No results found for: "PROLACTIN" No results found for: "CHOL", "TRIG", "HDL", "CHOLHDL", "VLDL", "LDLCALC" No results found for: "TSH"  Therapeutic Level Labs: No results found for: "LITHIUM" No results found for: "VALPROATE" No results found for: "CBMZ"  Current Medications: Current Outpatient Medications  Medication Sig Dispense Refill   cetirizine (ZYRTEC) 10 MG tablet Take 10 mg by mouth daily.     FOCALIN XR 35 MG CP24 Take 1 capsule by mouth every morning. 30 capsule 0   FOCALIN XR 35 MG CP24 Take 1 capsule by mouth every morning. 30 capsule 0   FOCALIN XR 35 MG CP24 Take 35 mg by mouth every morning. 30 capsule 0   guaiFENesin (DIABETIC TUSSIN EX) 100 MG/5ML liquid Take 200 mg by mouth once as needed. For cough/cold     guanFACINE (INTUNIV) 4 MG TB24 ER tablet Take 1 tablet (4 mg total) by mouth every morning. 30 tablet 2   metFORMIN (GLUCOPHAGE-XR) 500 MG 24 hr tablet Take by mouth.     pravastatin (PRAVACHOL) 40 MG tablet Take by mouth.     tretinoin (RETIN-A) 0.025 % cream Apply 1 application topically daily as needed. For skin breakouts     No current facility-administered medications for this visit.     Musculoskeletal: Strength & Muscle Tone: na Gait & Station: na Patient leans: N/A  Psychiatric Specialty Exam: Review of Systems  All other systems reviewed and are negative.   There were no vitals taken for this visit.There is no height or  weight on file to calculate BMI.  General Appearance: NA  Eye Contact:  NA  Speech:  Clear and Coherent  Volume:  Normal  Mood:  Euthymic  Affect:  NA  Thought Process:  Goal Directed  Orientation:  Full (Time, Place, and Person)  Thought Content: WDL   Suicidal Thoughts:  No  Homicidal Thoughts:  No  Memory:  Immediate;   Good Recent;   Fair Remote;   NA  Judgement:  Fair  Insight:  Shallow  Psychomotor Activity:  Normal  Concentration:  Concentration: Good and Attention Span: Good  Recall:  Good  Fund of Knowledge: Fair  Language: Good  Akathisia:  No  Handed:  Right  AIMS (if indicated): not done  Assets:  Communication Skills Desire for Improvement Physical Health Resilience Social Support Talents/Skills  ADL's:  Intact  Cognition: Impaired,  Mild  Sleep:  Good   Screenings:   Assessment and Plan: This patient is a 26 year old male with a history of learning delays ADHD and a past history of temper outburst.  He is stable on his current regimen.  He will continue Focalin XR 35 mg every morning for focus and guanfacine 4 mg daily for focus and agitation.  He will return to see me in 3 months  Collaboration of Care: Collaboration of Care: Primary Care Provider AEB notes will be released to PCP at patient request  Patient/Guardian was advised Release of Information must be obtained prior to any record release in order to collaborate their care with an outside provider. Patient/Guardian was advised if they have not already done so to contact the registration department to sign all necessary forms in order for [place] to release information regarding their care.   Consent: Patient/Guardian gives verbal consent for treatment and assignment of benefits for services provided during this visit. Patient/Guardian expressed understanding and agreed to proceed.    [name], MD 07/27/2022, 3:10 PM

## 2022-09-19 NOTE — Progress Notes (Signed)
No show

## 2022-10-05 ENCOUNTER — Other Ambulatory Visit (HOSPITAL_COMMUNITY): Payer: Self-pay | Admitting: Psychiatry

## 2022-10-05 DIAGNOSIS — F902 Attention-deficit hyperactivity disorder, combined type: Secondary | ICD-10-CM

## 2022-10-31 ENCOUNTER — Telehealth (INDEPENDENT_AMBULATORY_CARE_PROVIDER_SITE_OTHER): Payer: Medicare Other | Admitting: Psychiatry

## 2022-10-31 ENCOUNTER — Encounter (HOSPITAL_COMMUNITY): Payer: Self-pay | Admitting: Psychiatry

## 2022-10-31 DIAGNOSIS — F902 Attention-deficit hyperactivity disorder, combined type: Secondary | ICD-10-CM | POA: Diagnosis not present

## 2022-10-31 MED ORDER — FOCALIN XR 35 MG PO CP24: 1.0000 | ORAL_CAPSULE | Freq: Every morning | ORAL | 0 refills | Status: DC

## 2022-10-31 MED ORDER — FOCALIN XR 35 MG PO CP24: 35.0000 mg | ORAL_CAPSULE | ORAL | 0 refills | Status: DC

## 2022-10-31 MED ORDER — GUANFACINE HCL ER 4 MG PO TB24: 4.0000 mg | ORAL_TABLET | Freq: Every morning | ORAL | 2 refills | Status: DC

## 2022-10-31 NOTE — Progress Notes (Signed)
Virtual Visit via Telephone Note  I connected with Aaron Dean on 10/31/22 at  2:00 PM EST by telephone and verified that I am speaking with the correct person using two identifiers.  Location: Patient: home Provider: office   I discussed the limitations, risks, security and privacy concerns of performing an evaluation and management service by telephone and the availability of in person appointments. I also discussed with the patient that there may be a patient responsible charge related to this service. The patient expressed understanding and agreed to proceed.      I discussed the assessment and treatment plan with the patient. The patient was provided an opportunity to ask questions and all were answered. The patient agreed with the plan and demonstrated an understanding of the instructions.   The patient was advised to call back or seek an in-person evaluation if the symptoms worsen or if the condition fails to improve as anticipated.  I provided 15 minutes of non-face-to-face time during this encounter.   [name], MD  [facility] MD/PA/NP OP Progress Note  10/31/2022 1:58 PM Aaron Dean  MRN:  [id number]  Chief Complaint:  Chief Complaint  Patient presents with   ADHD   Follow-up   HPI:  Visit Diagnosis:   This patient is a 26year-old black male who lives with his mother grandmother and 2 uncles in [place]. He works as a Arboriculturist at [employer]   Apparently the patient was born at 28 weeks due to placenta disruption. He was in the NICU for 4 months. He had delayed milestones and needed speech and physical therapy. 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  The patient used to have a lot of disruptive agitated behaviors but he is doing much better now on a combination of Focalin and guanfacine.  The patient returns for follow-up after 3 months.  He states that he is doing well.  He  recently had a visit with his PCP and everything looked good.  He states his hemoglobin A1c has come down to 6.4.  He has not lost much weight and states his current weight is 215.  His mood is good.  He still enjoying his job at [employer].  He denies significant depression anger or irritability.  He is staying well focused.  He is sleeping well.   Past Psychiatric History: Long-term outpatient treatment  Past Medical History:  Past Medical History:  Diagnosis Date   ADHD (attention deficit hyperactivity disorder)    Asthma    seasonal related   Bronchitis    Chromosomal abnormality    Diabetes mellitus type II    Oppositional defiant disorder    Premature birth    Seasonal allergies    Sleep apnoea, obstructed     Past Surgical History:  Procedure Laterality Date   TONSILLECTOMY AND ADENOIDECTOMY     Tubes in ears during early childhood      Family Psychiatric History: See below  Family History:  Family History  Problem Relation Age of Onset   Alcohol abuse Father    Drug abuse Father    Autism spectrum disorder Cousin    Seizures Paternal Uncle    Anxiety disorder Maternal Grandmother    ADD / ADHD Neg Hx    Bipolar disorder Neg Hx    Dementia Neg Hx    Depression Neg Hx    OCD Neg Hx  Paranoid behavior Neg Hx    Schizophrenia Neg Hx    Physical abuse Neg Hx    Sexual abuse Neg Hx     Social History:  Social History   Socioeconomic History   Marital status: Single    Spouse name: Not on file   Number of children: Not on file   Years of education: Not on file   Highest education level: Not on file  Occupational History   Not on file  Tobacco Use   Smoking status: Never   Smokeless tobacco: Never  Substance and Sexual Activity   Alcohol use: No   Drug use: No   Sexual activity: Never  Other Topics Concern   Not on file  Social History Narrative   Not on file   Social Determinants of Health   Financial Resource Strain: Not on file  Food  Insecurity: Not on file  Transportation Needs: Not on file  Physical Activity: Not on file  Stress: Not on file  Social Connections: Not on file    Allergies:  Allergies  Allergen Reactions   Abilify [Aripiprazole] Other (See Comments)    Altered mental state   Amoxicillin-Pot Clavulanate Rash    Metabolic Disorder Labs: No results found for: "HGBA1C", "MPG" No results found for: "PROLACTIN" No results found for: "CHOL", "TRIG", "HDL", "CHOLHDL", "VLDL", "LDLCALC" No results found for: "TSH"  Therapeutic Level Labs: No results found for: "LITHIUM" No results found for: "VALPROATE" No results found for: "CBMZ"  Current Medications: Current Outpatient Medications  Medication Sig Dispense Refill   cetirizine (ZYRTEC) 10 MG tablet Take 10 mg by mouth daily.     FOCALIN XR 35 MG CP24 Take 1 capsule by mouth every morning. 30 capsule 0   FOCALIN XR 35 MG CP24 Take 1 capsule by mouth every morning. 30 capsule 0   FOCALIN XR 35 MG CP24 Take 35 mg by mouth every morning. 30 capsule 0   guaiFENesin (DIABETIC TUSSIN EX) 100 MG/5ML liquid Take 200 mg by mouth once as needed. For cough/cold     guanFACINE (INTUNIV) 4 MG TB24 ER tablet Take 1 tablet (4 mg total) by mouth every morning. 30 tablet 2   metFORMIN (GLUCOPHAGE-XR) 500 MG 24 hr tablet Take by mouth.     pravastatin (PRAVACHOL) 40 MG tablet Take by mouth.     tretinoin (RETIN-A) 0.025 % cream Apply 1 application topically daily as needed. For skin breakouts     No current facility-administered medications for this visit.     Musculoskeletal: Strength & Muscle Tone: na Gait & Station: na Patient leans: N/A  Psychiatric Specialty Exam: Review of Systems  All other systems reviewed and are negative.   There were no vitals taken for this visit.There is no height or weight on file to calculate BMI.  General Appearance: NA  Eye Contact:  NA  Speech:  Clear and Coherent  Volume:  Normal  Mood:  Euthymic  Affect:  NA   Thought Process:  Goal Directed  Orientation:  Full (Time, Place, and Person)  Thought Content: WDL   Suicidal Thoughts:  No  Homicidal Thoughts:  No  Memory:  Immediate;   Good Recent;   Good Remote;   Fair  Judgement:  Good  Insight:  Fair  Psychomotor Activity:  Normal  Concentration:  Concentration: Good and Attention Span: Good  Recall:  Good  Fund of Knowledge: Fair  Language: Good  Akathisia:  No  Handed:  Right  AIMS (if indicated): not  done  Assets:  Communication Skills Desire for Improvement Physical Health Resilience Social Support Talents/Skills  ADL's:  Intact  Cognition: Impaired,  Mild  Sleep:  Good   Screenings:   Assessment and Plan: This patient is a 25 year old male with a history of learning delays ADHD in the past history of temper outburst.  He is stable on his current regimen.  He will continue Focalin XR 35 mg every morning for focus and guanfacine 4 mg daily for focus and agitation.  He will return to see me in 3 months  Collaboration of Care: Collaboration of Care: Primary Care Provider AEB will be released to PCP at patient's request  Patient/Guardian was advised Release of Information must be obtained prior to any record release in order to collaborate their care with an outside provider. Patient/Guardian was advised if they have not already done so to contact the registration department to sign all necessary forms in order for [place] to release information regarding their care.   Consent: Patient/Guardian gives verbal consent for treatment and assignment of benefits for services provided during this visit. Patient/Guardian expressed understanding and agreed to proceed.    Levonne Spiller, MD 10/31/2022, 1:58 PM

## 2023-01-02 ENCOUNTER — Other Ambulatory Visit (HOSPITAL_COMMUNITY): Payer: Self-pay | Admitting: Psychiatry

## 2023-01-02 DIAGNOSIS — F902 Attention-deficit hyperactivity disorder, combined type: Secondary | ICD-10-CM

## 2023-01-31 ENCOUNTER — Telehealth (INDEPENDENT_AMBULATORY_CARE_PROVIDER_SITE_OTHER): Payer: Medicare Other | Admitting: Psychiatry

## 2023-01-31 ENCOUNTER — Encounter (HOSPITAL_COMMUNITY): Payer: Self-pay | Admitting: Psychiatry

## 2023-01-31 DIAGNOSIS — F902 Attention-deficit hyperactivity disorder, combined type: Secondary | ICD-10-CM

## 2023-01-31 MED ORDER — FOCALIN XR 35 MG PO CP24: 1.0000 | ORAL_CAPSULE | Freq: Every morning | ORAL | 0 refills | Status: DC

## 2023-01-31 MED ORDER — GUANFACINE HCL ER 4 MG PO TB24: 4.0000 mg | ORAL_TABLET | Freq: Every morning | ORAL | 2 refills | Status: DC

## 2023-01-31 MED ORDER — FOCALIN XR 35 MG PO CP24: 35.0000 mg | ORAL_CAPSULE | ORAL | 0 refills | Status: DC

## 2023-01-31 NOTE — Progress Notes (Signed)
Virtual Visit via Video Note  I connected with Aaron Dean on 01/31/23 at  1:00 PM EST by a video enabled telemedicine application and verified that I am speaking with the correct person using two identifiers.  Location: Patient: home Provider: office   I discussed the limitations of evaluation and management by telemedicine and the availability of in person appointments. The patient expressed understanding and agreed to proceed.     I discussed the assessment and treatment plan with the patient. The patient was provided an opportunity to ask questions and all were answered. The patient agreed with the plan and demonstrated an understanding of the instructions.   The patient was advised to call back or seek an in-person evaluation if the symptoms worsen or if the condition fails to improve as anticipated.  I provided 12 minutes of non-face-to-face time during this encounter.   Levonne Spiller, MD  [facility] MD/PA/NP OP Progress Note  01/31/2023 1:09 PM Aaron Dean  MRN:  [id number]  Chief Complaint:  Chief Complaint  Patient presents with   ADHD   HPI: This patient is a 27year-old black male who lives with his mother grandmother and 2 uncles in [place]. He works as a Sports coach at [employer]   Apparently the patient was born at 54 weeks due to placenta disruption. He was in the NICU for 4 months. He had delayed milestones and needed speech and physical therapy. 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  The patient used to have a lot of disruptive agitated behaviors but he is doing much better now on a combination of Focalin and guanfacine  The patient returns for follow-up after 3 months regarding his ADHD.  Overall he is doing very well.  He still enjoys his job at [employer].  His mood has been good.  He denies significant depression anger and irritability.  He is staying focused and he is sleeping  well. Visit Diagnosis:    ICD-10-CM   1. ADHD (attention deficit hyperactivity disorder), combined type  F90.2 FOCALIN XR 35 MG CP24    FOCALIN XR 35 MG CP24    guanFACINE (INTUNIV) 4 MG TB24 ER tablet      Past Psychiatric History: Long-term outpatient treatment  Past Medical History:  Past Medical History:  Diagnosis Date   ADHD (attention deficit hyperactivity disorder)    Asthma    seasonal related   Bronchitis    Chromosomal abnormality    Diabetes mellitus type II    Oppositional defiant disorder    Premature birth    Seasonal allergies    Sleep apnoea, obstructed     Past Surgical History:  Procedure Laterality Date   TONSILLECTOMY AND ADENOIDECTOMY     Tubes in ears during early childhood      Family Psychiatric History: See below  Family History:  Family History  Problem Relation Age of Onset   Alcohol abuse Father    Drug abuse Father    Autism spectrum disorder Cousin    Seizures Paternal Uncle    Anxiety disorder Maternal Grandmother    ADD / ADHD Neg Hx    Bipolar disorder Neg Hx    Dementia Neg Hx    Depression Neg Hx    OCD Neg Hx    Paranoid behavior Neg Hx    Schizophrenia Neg Hx    Physical abuse Neg Hx    Sexual abuse  Neg Hx     Social History:  Social History   Socioeconomic History   Marital status: Single    Spouse name: Not on file   Number of children: Not on file   Years of education: Not on file   Highest education level: Not on file  Occupational History   Not on file  Tobacco Use   Smoking status: Never   Smokeless tobacco: Never  Substance and Sexual Activity   Alcohol use: No   Drug use: No   Sexual activity: Never  Other Topics Concern   Not on file  Social History Narrative   Not on file   Social Determinants of Health   Financial Resource Strain: Not on file  Food Insecurity: Not on file  Transportation Needs: Not on file  Physical Activity: Not on file  Stress: Not on file  Social Connections: Not on  file    Allergies:  Allergies  Allergen Reactions   Abilify [Aripiprazole] Other (See Comments)    Altered mental state   Amoxicillin-Pot Clavulanate Rash    Metabolic Disorder Labs: No results found for: "HGBA1C", "MPG" No results found for: "PROLACTIN" No results found for: "CHOL", "TRIG", "HDL", "CHOLHDL", "VLDL", "LDLCALC" No results found for: "TSH"  Therapeutic Level Labs: No results found for: "LITHIUM" No results found for: "VALPROATE" No results found for: "CBMZ"  Current Medications: Current Outpatient Medications  Medication Sig Dispense Refill   cetirizine (ZYRTEC) 10 MG tablet Take 10 mg by mouth daily.     FOCALIN XR 35 MG CP24 Take 1 capsule by mouth every morning. 30 capsule 0   FOCALIN XR 35 MG CP24 Take 1 capsule by mouth every morning. 30 capsule 0   FOCALIN XR 35 MG CP24 Take 35 mg by mouth every morning. 30 capsule 0   guaiFENesin (DIABETIC TUSSIN EX) 100 MG/5ML liquid Take 200 mg by mouth once as needed. For cough/cold     guanFACINE (INTUNIV) 4 MG TB24 ER tablet Take 1 tablet (4 mg total) by mouth every morning. 30 tablet 2   metFORMIN (GLUCOPHAGE-XR) 500 MG 24 hr tablet Take by mouth.     pravastatin (PRAVACHOL) 40 MG tablet Take by mouth.     tretinoin (RETIN-A) 0.025 % cream Apply 1 application topically daily as needed. For skin breakouts     No current facility-administered medications for this visit.     Musculoskeletal: Strength & Muscle Tone: within normal limits Gait & Station: normal Patient leans: N/A  Psychiatric Specialty Exam: Review of Systems  All other systems reviewed and are negative.   There were no vitals taken for this visit.There is no height or weight on file to calculate BMI.  General Appearance: Casual and Fairly Groomed  Eye Contact:  Good  Speech:  Clear and Coherent  Volume:  Normal  Mood:  Euthymic  Affect:  Congruent  Thought Process:  Goal Directed  Orientation:  Full (Time, Place, and Person)  Thought  Content: WDL   Suicidal Thoughts:  No  Homicidal Thoughts:  No  Memory:  Immediate;   Good Recent;   Fair Remote;   NA  Judgement:  Fair  Insight:  Shallow  Psychomotor Activity:  Normal  Concentration:  Concentration: Good and Attention Span: Good  Recall:  Good  Fund of Knowledge: Fair  Language: Good  Akathisia:  No  Handed:  Right  AIMS (if indicated): not done  Assets:  Communication Skills Desire for Improvement Physical Health Resilience Social Support  ADL's:  Intact  Cognition: Impaired,  Mild  Sleep:  Good   Screenings:   Assessment and Plan: This patient is a 27 year old male with a history of learning delays ADHD and in the past temper outburst.  He has not had any of the temper issues and remained stable on his current regimen.  He will continue Focalin XR 35 mg every morning for focus and guanfacine 4 mg daily for focus and agitation.  He will return to see me in 3 months  Collaboration of Care: Collaboration of Care: Primary Care Provider AEB notes to be shared with PCP at patient's request  Patient/Guardian was advised Release of Information must be obtained prior to any record release in order to collaborate their care with an outside provider. Patient/Guardian was advised if they have not already done so to contact the registration department to sign all necessary forms in order for [place] to release information regarding their care.   Consent: Patient/Guardian gives verbal consent for treatment and assignment of benefits for services provided during this visit. Patient/Guardian expressed understanding and agreed to proceed.    Levonne Spiller, MD 01/31/2023, 1:09 PM

## 2023-02-05 ENCOUNTER — Other Ambulatory Visit (HOSPITAL_COMMUNITY): Payer: Self-pay | Admitting: Psychiatry

## 2023-02-05 ENCOUNTER — Telehealth (HOSPITAL_COMMUNITY): Payer: Self-pay | Admitting: *Deleted

## 2023-02-05 DIAGNOSIS — F902 Attention-deficit hyperactivity disorder, combined type: Secondary | ICD-10-CM

## 2023-02-05 MED ORDER — DEXMETHYLPHENIDATE HCL ER 35 MG PO CP24: 35.0000 mg | ORAL_CAPSULE | ORAL | 0 refills | Status: DC

## 2023-02-05 MED ORDER — DEXMETHYLPHENIDATE HCL ER 35 MG PO CP24: 1.0000 | ORAL_CAPSULE | Freq: Every morning | ORAL | 0 refills | Status: DC

## 2023-02-05 MED ORDER — GUANFACINE HCL ER 4 MG PO TB24: 4.0000 mg | ORAL_TABLET | Freq: Every morning | ORAL | 2 refills | Status: DC

## 2023-02-05 NOTE — Telephone Encounter (Signed)
Resent as generic

## 2023-02-05 NOTE — Telephone Encounter (Signed)
Informed patient and he verbalized understanding

## 2023-02-05 NOTE — Telephone Encounter (Signed)
Patient called stating that [facility] do not have his Focalin XR in stock. Per pt [facility] in [place] have it in [place] and would like for provider to please send script to them. Per pt he is out of script.   Per pt he would like to switch all of his scripts to [facility]. Per pt he would like for provider to please send in all of her scripts that she fills for him to [facility] when she gets the chance.

## 2023-02-05 NOTE — Telephone Encounter (Signed)
sent

## 2023-02-05 NOTE — Telephone Encounter (Signed)
Staff called pharmacy and spoke with Izora Gala the pharmacist. Per Izora Gala, she order some more of the generic/brand name of the [place]. Per Izora Gala it would be a good idea for provider to please send in generic/brand name Focalin for just in case so patient can get his refills.

## 2023-02-15 ENCOUNTER — Encounter (HOSPITAL_COMMUNITY): Payer: Self-pay | Admitting: Psychiatry

## 2023-03-16 ENCOUNTER — Telehealth (HOSPITAL_COMMUNITY): Payer: Self-pay | Admitting: *Deleted

## 2023-03-16 ENCOUNTER — Other Ambulatory Visit (HOSPITAL_COMMUNITY): Payer: Self-pay | Admitting: Psychiatry

## 2023-03-16 DIAGNOSIS — F902 Attention-deficit hyperactivity disorder, combined type: Secondary | ICD-10-CM

## 2023-03-16 MED ORDER — GUANFACINE HCL ER 4 MG PO TB24: 4.0000 mg | ORAL_TABLET | Freq: Every morning | ORAL | 2 refills | Status: DC

## 2023-03-16 NOTE — Telephone Encounter (Signed)
Informed patient and he verbalized understanding and he stated he got it as soon as he got off of work

## 2023-03-16 NOTE — Telephone Encounter (Signed)
Spoke with patient pharmacy [facility], they just changed to another system and they do not see patient patient guanfacine intuniv in their system although patient chart is saying they have it in stock. Per pt he called to [facility] and they are informing him that they do not have it in stock. Per pt, [facility] have it in stock and would like for provider to please send in script to pharmacy. Per patient he is out of his script and needs refills. Per [facility] pharmacy, due to them not having script on file, provider needs to send in another script to them for them to refill patient medication.

## 2023-03-16 NOTE — Telephone Encounter (Signed)
sent 

## 2023-05-01 ENCOUNTER — Encounter (HOSPITAL_COMMUNITY): Payer: Self-pay | Admitting: Psychiatry

## 2023-05-01 ENCOUNTER — Telehealth (INDEPENDENT_AMBULATORY_CARE_PROVIDER_SITE_OTHER): Payer: Medicare Other | Admitting: Psychiatry

## 2023-05-01 DIAGNOSIS — F902 Attention-deficit hyperactivity disorder, combined type: Secondary | ICD-10-CM

## 2023-05-01 MED ORDER — DEXMETHYLPHENIDATE HCL ER 35 MG PO CP24: 1.0000 | ORAL_CAPSULE | Freq: Every morning | ORAL | 0 refills | Status: DC

## 2023-05-01 MED ORDER — DEXMETHYLPHENIDATE HCL ER 35 MG PO CP24: 35.0000 mg | ORAL_CAPSULE | ORAL | 0 refills | Status: DC

## 2023-05-01 MED ORDER — GUANFACINE HCL ER 4 MG PO TB24: 4.0000 mg | ORAL_TABLET | Freq: Every morning | ORAL | 2 refills | Status: DC

## 2023-05-01 NOTE — Progress Notes (Signed)
Virtual Visit via Video Note  I connected with Aaron Dean on 05/01/23 at  1:00 PM EDT by a video enabled telemedicine application and verified that I am speaking with the correct person using two identifiers.  Location: Patient: home Provider: office   I discussed the limitations of evaluation and management by telemedicine and the availability of in person appointments. The patient expressed understanding and agreed to proceed.     I discussed the assessment and treatment plan with the patient. The patient was provided an opportunity to ask questions and all were answered. The patient agreed with the plan and demonstrated an understanding of the instructions.   The patient was advised to call back or seek an in-person evaluation if the symptoms worsen or if the condition fails to improve as anticipated.  I provided 15 minutes of non-face-to-face time during this encounter.   [name], MD  [facility] MD/PA/NP OP Progress Note  05/01/2023 1:11 PM Aaron Dean  MRN:  [id number]  Chief Complaint:  Chief Complaint  Patient presents with   ADHD   Agitation   Follow-up   HPI:  This patient is a 27year-old black male who lives with his mother grandmother and 2 uncles in [place]. He works as a Arboriculturist at [employer]   The patient returns for follow-up regarding his ADHD he had history of agitation.  He continues to do very well.  He is enjoying his job.  His health has been good.  His mood has been good.  He denies significant depression anxiety anger or irritability.  He is sleeping well.  He is focusing well by his report Visit Diagnosis:    ICD-10-CM   1. ADHD (attention deficit hyperactivity disorder), combined type  F90.2 Dexmethylphenidate HCl (FOCALIN XR) 35 MG CP24    Dexmethylphenidate HCl (FOCALIN XR) 35 MG CP24    guanFACINE (INTUNIV) 4 MG TB24 ER tablet      Past Psychiatric History: Long-term outpatient treatment  Past Medical History:  Past Medical History:   Diagnosis Date   ADHD (attention deficit hyperactivity disorder)    Asthma    seasonal related   Bronchitis    Chromosomal abnormality    Diabetes mellitus type II    Oppositional defiant disorder    Premature birth    Seasonal allergies    Sleep apnoea, obstructed     Past Surgical History:  Procedure Laterality Date   TONSILLECTOMY AND ADENOIDECTOMY     Tubes in ears during early childhood      Family Psychiatric History: See below  Family History:  Family History  Problem Relation Age of Onset   Alcohol abuse Father    Drug abuse Father    Autism spectrum disorder Cousin    Seizures Paternal Uncle    Anxiety disorder Maternal Grandmother    ADD / ADHD Neg Hx    Bipolar disorder Neg Hx    Dementia Neg Hx    Depression Neg Hx    OCD Neg Hx    Paranoid behavior Neg Hx    Schizophrenia Neg Hx    Physical abuse Neg Hx    Sexual abuse Neg Hx     Social History:  Social History   Socioeconomic History   Marital status: Single    Spouse name: Not on file   Number of children: Not on file   Years of education: Not on file   Highest education level: Not on file  Occupational History   Not on file  Tobacco Use   Smoking status: Never   Smokeless tobacco: Never  Substance and Sexual Activity   Alcohol use: No   Drug use: No   Sexual activity: Never  Other Topics Concern   Not on file  Social History Narrative   Not on file   Social Determinants of Health   Financial Resource Strain: Not on file  Food Insecurity: Not on file  Transportation Needs: Not on file  Physical Activity: Not on file  Stress: Not on file  Social Connections: Not on file    Allergies:  Allergies  Allergen Reactions   Abilify [Aripiprazole] Other (See Comments)    Altered mental state   Amoxicillin-Pot Clavulanate Rash    Metabolic Disorder Labs: No results found for: "HGBA1C", "MPG" No results found for: "PROLACTIN" No results found for: "CHOL", "TRIG", "HDL",  "CHOLHDL", "VLDL", "LDLCALC" No results found for: "TSH"  Therapeutic Level Labs: No results found for: "LITHIUM" No results found for: "VALPROATE" No results found for: "CBMZ"  Current Medications: Current Outpatient Medications  Medication Sig Dispense Refill   cetirizine (ZYRTEC) 10 MG tablet Take 10 mg by mouth daily.     Dexmethylphenidate HCl (FOCALIN XR) 35 MG CP24 Take 1 capsule by mouth every morning. 30 capsule 0   Dexmethylphenidate HCl (FOCALIN XR) 35 MG CP24 Take 35 mg by mouth every morning. 30 capsule 0   Dexmethylphenidate HCl (FOCALIN XR) 35 MG CP24 Take 1 capsule by mouth every morning. 30 capsule 0   guaiFENesin (DIABETIC TUSSIN EX) 100 MG/5ML liquid Take 200 mg by mouth once as needed. For cough/cold     guanFACINE (INTUNIV) 4 MG TB24 ER tablet Take 1 tablet (4 mg total) by mouth every morning. 30 tablet 2   metFORMIN (GLUCOPHAGE-XR) 500 MG 24 hr tablet Take by mouth.     pravastatin (PRAVACHOL) 40 MG tablet Take by mouth.     tretinoin (RETIN-A) 0.025 % cream Apply 1 application topically daily as needed. For skin breakouts     No current facility-administered medications for this visit.     Musculoskeletal: Strength & Muscle Tone: within normal limits Gait & Station: normal Patient leans: N/A  Psychiatric Specialty Exam: Review of Systems  All other systems reviewed and are negative.   There were no vitals taken for this visit.There is no height or weight on file to calculate BMI.  General Appearance: Casual and Fairly Groomed  Eye Contact:  Good  Speech:  Clear and Coherent  Volume:  Normal  Mood:  Euthymic  Affect:  Congruent  Thought Process:  Goal Directed  Orientation:  Full (Time, Place, and Person)  Thought Content: WDL   Suicidal Thoughts:  No  Homicidal Thoughts:  No  Memory:  Immediate;   Good Recent;   Good Remote;   NA  Judgement:  Good  Insight:  Fair  Psychomotor Activity:  Normal  Concentration:  Concentration: Good and  Attention Span: Good  Recall:  Fair  Fund of Knowledge: Fair  Language: Good  Akathisia:  No  Handed:  Right  AIMS (if indicated): not done  Assets:  Communication Skills Desire for Improvement Physical Health Resilience Social Support  ADL's:  Intact  Cognition: Impaired,  Mild  Sleep:  Good   Screenings:   Assessment and Plan: This patient is a 27 year old male with a history of learning delays ADHD and temper outbursts in the past.  He has not had any of the temper issues in recent years.  He is doing well  on his current regimen so we will continue Focalin XR 35 mg every morning for focus and guanfacine 4 mg daily for focus and agitation.  He will return to see me in 3 months  Collaboration of Care: Collaboration of Care: Primary Care Provider AEB notes are shared with PCP on the epic system  Patient/Guardian was advised Release of Information must be obtained prior to any record release in order to collaborate their care with an outside provider. Patient/Guardian was advised if they have not already done so to contact the registration department to sign all necessary forms in order for [place] to release information regarding their care.   Consent: Patient/Guardian gives verbal consent for treatment and assignment of benefits for services provided during this visit. Patient/Guardian expressed understanding and agreed to proceed.    [name], MD 05/01/2023, 1:11 PM

## 2023-06-18 ENCOUNTER — Other Ambulatory Visit (HOSPITAL_COMMUNITY): Payer: Self-pay | Admitting: Psychiatry

## 2023-06-18 ENCOUNTER — Telehealth (HOSPITAL_COMMUNITY): Payer: Self-pay

## 2023-06-18 DIAGNOSIS — F902 Attention-deficit hyperactivity disorder, combined type: Secondary | ICD-10-CM

## 2023-06-18 MED ORDER — DEXMETHYLPHENIDATE HCL ER 35 MG PO CP24: 1.0000 | ORAL_CAPSULE | Freq: Every morning | ORAL | 0 refills | Status: DC

## 2023-06-18 NOTE — Telephone Encounter (Signed)
Spoke with pt advised rx has been sent to pharmacy pt verbalized understanding

## 2023-06-18 NOTE — Telephone Encounter (Signed)
sent 

## 2023-06-18 NOTE — Telephone Encounter (Signed)
Pt called in asking if his Dexmethylphenidate HCl (FOCALIN XR) 35 MG CP24 can be sent to [facility] in [place] due to [facility] being on backorder. Pt scheduled 07/29/23. Please advise.

## 2023-07-18 ENCOUNTER — Other Ambulatory Visit (HOSPITAL_COMMUNITY): Payer: Self-pay | Admitting: Psychiatry

## 2023-07-18 ENCOUNTER — Telehealth (HOSPITAL_COMMUNITY): Payer: Self-pay

## 2023-07-18 DIAGNOSIS — F902 Attention-deficit hyperactivity disorder, combined type: Secondary | ICD-10-CM

## 2023-07-18 MED ORDER — DEXMETHYLPHENIDATE HCL ER 35 MG PO CP24
1.0000 | ORAL_CAPSULE | Freq: Every morning | ORAL | 0 refills | Status: DC
Start: 2023-07-18 — End: 2023-08-01

## 2023-07-18 MED ORDER — DEXMETHYLPHENIDATE HCL ER 35 MG PO CP24: 35.0000 mg | ORAL_CAPSULE | ORAL | 0 refills | Status: DC

## 2023-07-18 NOTE — Telephone Encounter (Signed)
Spoke with pt advised rx has been sent to pharmacy pt verbalized understanding

## 2023-07-18 NOTE — Telephone Encounter (Signed)
Pt called back [facility] [place] [facility] available he is wanting to know if Dr Tenny Craw can [facility]

## 2023-07-18 NOTE — Telephone Encounter (Signed)
Pt aware medication has been sent to pharmacy.  

## 2023-07-18 NOTE — Telephone Encounter (Signed)
Pt called in stating that he is completely out of his Dexmethylphenidate HCl (FOCALIN XR) 35 MG CP24 pt states that he has called all surrounding pharmacies in [address] and [place]. I have tried [facility] pharmacy they don't have it either. Pt unable to travel to [place] and is wanting to see if a new prescription can be prescribed to him. Please advise

## 2023-07-18 NOTE — Telephone Encounter (Signed)
Pt called in needing rx for Dexmethylphenidate HCl (FOCALIN XR) 35 MG CP24 sent to [facility] in [place]. Pt scheduled 08/01/23. Please advise.

## 2023-07-20 ENCOUNTER — Telehealth (HOSPITAL_COMMUNITY): Payer: Self-pay

## 2023-07-20 ENCOUNTER — Other Ambulatory Visit (HOSPITAL_COMMUNITY): Payer: Self-pay | Admitting: Psychiatry

## 2023-07-20 DIAGNOSIS — F902 Attention-deficit hyperactivity disorder, combined type: Secondary | ICD-10-CM

## 2023-07-20 MED ORDER — GUANFACINE HCL ER 4 MG PO TB24
4.0000 mg | ORAL_TABLET | Freq: Every morning | ORAL | 2 refills | Status: DC
Start: 2023-07-20 — End: 2023-08-01

## 2023-07-20 MED ORDER — GUANFACINE HCL ER 4 MG PO TB24
4.0000 mg | ORAL_TABLET | Freq: Every morning | ORAL | 2 refills | Status: DC
Start: 2023-07-20 — End: 2023-07-20

## 2023-07-20 NOTE — Telephone Encounter (Signed)
Pt called in needing a refill on his guanFACINE (INTUNIV) 4 MG TB24 ER tablet sent in to [facility]. Pt scheduled 08/01/23. Please advise.

## 2023-07-20 NOTE — Telephone Encounter (Signed)
Spoke with pt advised rx has been sent in pt verbalized understanding

## 2023-07-20 NOTE — Telephone Encounter (Signed)
sent 

## 2023-08-01 ENCOUNTER — Encounter (HOSPITAL_COMMUNITY): Payer: Self-pay | Admitting: Psychiatry

## 2023-08-01 ENCOUNTER — Telehealth (HOSPITAL_COMMUNITY): Payer: Medicare Other | Admitting: Psychiatry

## 2023-08-01 DIAGNOSIS — F902 Attention-deficit hyperactivity disorder, combined type: Secondary | ICD-10-CM

## 2023-08-01 MED ORDER — GUANFACINE HCL ER 4 MG PO TB24
4.0000 mg | ORAL_TABLET | Freq: Every morning | ORAL | 2 refills | Status: DC
Start: 2023-08-01 — End: 2023-11-01

## 2023-08-01 MED ORDER — DEXMETHYLPHENIDATE HCL ER 35 MG PO CP24: 35.0000 mg | ORAL_CAPSULE | ORAL | 0 refills | Status: DC

## 2023-08-01 MED ORDER — DEXMETHYLPHENIDATE HCL ER 35 MG PO CP24
1.0000 | ORAL_CAPSULE | Freq: Every morning | ORAL | 0 refills | Status: DC
Start: 2023-08-01 — End: 2023-09-05

## 2023-08-01 MED ORDER — DEXMETHYLPHENIDATE HCL ER 35 MG PO CP24
1.0000 | ORAL_CAPSULE | Freq: Every morning | ORAL | 0 refills | Status: DC
Start: 2023-08-01 — End: 2023-11-01

## 2023-08-01 NOTE — Progress Notes (Signed)
Virtual Visit via Video Note  I connected with Aaron Dean on 08/01/23 at  1:00 PM EDT by a video enabled telemedicine application and verified that I am speaking with the correct person using two identifiers.  Location: Patient: home Provider: office   I discussed the limitations of evaluation and management by telemedicine and the availability of in person appointments. The patient expressed understanding and agreed to proceed.     I discussed the assessment and treatment plan with the patient. The patient was provided an opportunity to ask questions and all were answered. The patient agreed with the plan and demonstrated an understanding of the instructions.   The patient was advised to call back or seek an in-person evaluation if the symptoms worsen or if the condition fails to improve as anticipated.  I provided 15 minutes of non-face-to-face time during this encounter.   [name], MD  [facility] MD/PA/NP OP Progress Note  08/01/2023 1:13 PM Aaron Dean  MRN:  [id number]  Chief Complaint:  Chief Complaint  Patient presents with   ADHD   Follow-up   HPI: This patient is a 27 year old black male who is with his mother grandmother and 2 uncles in [place].  He works as a Arboriculturist at [employer]  The patient returns for follow-up regarding his ADHD.  He states that he continues to do well.  At times he has had trouble getting the Focalin XR because of the national shortage.  However it does well to help him focus and pay attention.  He has not had any temper outbursts.  He is enjoying his work.  He denies anger or irritability.  He is sleeping well. Visit Diagnosis:    ICD-10-CM   1. ADHD (attention deficit hyperactivity disorder), combined type  F90.2 guanFACINE (INTUNIV) 4 MG TB24 ER tablet    Dexmethylphenidate HCl (FOCALIN XR) 35 MG CP24    Dexmethylphenidate HCl (FOCALIN XR) 35 MG CP24      Past Psychiatric History: Long-term outpatient treatment  Past Medical  History:  Past Medical History:  Diagnosis Date   ADHD (attention deficit hyperactivity disorder)    Asthma    seasonal related   Bronchitis    Chromosomal abnormality    Diabetes mellitus type II    Oppositional defiant disorder    Premature birth    Seasonal allergies    Sleep apnoea, obstructed     Past Surgical History:  Procedure Laterality Date   TONSILLECTOMY AND ADENOIDECTOMY     Tubes in ears during early childhood      Family Psychiatric History: See below  Family History:  Family History  Problem Relation Age of Onset   Alcohol abuse Father    Drug abuse Father    Autism spectrum disorder Cousin    Seizures Paternal Uncle    Anxiety disorder Maternal Grandmother    ADD / ADHD Neg Hx    Bipolar disorder Neg Hx    Dementia Neg Hx    Depression Neg Hx    OCD Neg Hx    Paranoid behavior Neg Hx    Schizophrenia Neg Hx    Physical abuse Neg Hx    Sexual abuse Neg Hx     Social History:  Social History   Socioeconomic History   Marital status: Single    Spouse name: Not on file   Number of children: Not on file   Years of education: Not on file   Highest education level: Not on file  Occupational  History   Not on file  Tobacco Use   Smoking status: Never   Smokeless tobacco: Never  Substance and Sexual Activity   Alcohol use: No   Drug use: No   Sexual activity: Never  Other Topics Concern   Not on file  Social History Narrative   Not on file   Social Determinants of Health   Financial Resource Strain: Not on file  Food Insecurity: Not on file  Transportation Needs: Not on file  Physical Activity: Not on file  Stress: Not on file  Social Connections: Not on file    Allergies:  Allergies  Allergen Reactions   Abilify [Aripiprazole] Other (See Comments)    Altered mental state   Amoxicillin-Pot Clavulanate Rash    Metabolic Disorder Labs: No results found for: "HGBA1C", "MPG" No results found for: "PROLACTIN" No results found for:  "CHOL", "TRIG", "HDL", "CHOLHDL", "VLDL", "LDLCALC" No results found for: "TSH"  Therapeutic Level Labs: No results found for: "LITHIUM" No results found for: "VALPROATE" No results found for: "CBMZ"  Current Medications: Current Outpatient Medications  Medication Sig Dispense Refill   cetirizine (ZYRTEC) 10 MG tablet Take 10 mg by mouth daily.     Dexmethylphenidate HCl (FOCALIN XR) 35 MG CP24 Take 1 capsule by mouth every morning. 30 capsule 0   Dexmethylphenidate HCl (FOCALIN XR) 35 MG CP24 Take 35 mg by mouth every morning. 30 capsule 0   Dexmethylphenidate HCl (FOCALIN XR) 35 MG CP24 Take 1 capsule by mouth every morning. 30 capsule 0   guaiFENesin (DIABETIC TUSSIN EX) 100 MG/5ML liquid Take 200 mg by mouth once as needed. For cough/cold     guanFACINE (INTUNIV) 4 MG TB24 ER tablet Take 1 tablet (4 mg total) by mouth every morning. 30 tablet 2   metFORMIN (GLUCOPHAGE-XR) 500 MG 24 hr tablet Take by mouth.     pravastatin (PRAVACHOL) 40 MG tablet Take by mouth.     tretinoin (RETIN-A) 0.025 % cream Apply 1 application topically daily as needed. For skin breakouts     No current facility-administered medications for this visit.     Musculoskeletal: Strength & Muscle Tone: within normal limits Gait & Station: normal Patient leans: N/A  Psychiatric Specialty Exam: Review of Systems  All other systems reviewed and are negative.   There were no vitals taken for this visit.There is no height or weight on file to calculate BMI.  General Appearance: Casual and Fairly Groomed  Eye Contact:  Good  Speech:  Clear and Coherent  Volume:  Normal  Mood:  Euthymic  Affect:  Congruent  Thought Process:  Goal Directed  Orientation:  Full (Time, Place, and Person)  Thought Content: WDL   Suicidal Thoughts:  No  Homicidal Thoughts:  No  Memory:  Immediate;   Good Recent;   Fair Remote;   NA  Judgement:  Good  Insight:  Shallow  Psychomotor Activity:  Normal  Concentration:   Concentration: Good and Attention Span: Good  Recall:  Good  Fund of Knowledge: Fair  Language: Good  Akathisia:  No  Handed:  Right  AIMS (if indicated): not done  Assets:  Communication Skills Desire for Improvement Physical Health Resilience Social Support  ADL's:  Intact  Cognition: Impaired,  Mild  Sleep:  Good   Screenings:   Assessment and Plan: This patient is a 27 year old male with a history of learning delays ADHD and temper outbursts in the past.  He does very well on his current regimen so we  will continue Focalin XR 35 mg every morning for ADHD and guanfacine 4 mg daily for focus and and agitation.  He will return to see me in 3 months  Collaboration of Care: Collaboration of Care: Primary Care Provider AEB notes are shared with PCP on the epic system  Patient/Guardian was advised Release of Information must be obtained prior to any record release in order to collaborate their care with an outside provider. Patient/Guardian was advised if they have not already done so to contact the registration department to sign all necessary forms in order for [place] to release information regarding their care.   Consent: Patient/Guardian gives verbal consent for treatment and assignment of benefits for services provided during this visit. Patient/Guardian expressed understanding and agreed to proceed.    [name], MD 08/01/2023, 1:13 PM

## 2023-08-03 ENCOUNTER — Other Ambulatory Visit (HOSPITAL_COMMUNITY): Payer: Self-pay | Admitting: Psychiatry

## 2023-08-03 ENCOUNTER — Telehealth (HOSPITAL_COMMUNITY): Payer: Self-pay

## 2023-08-03 MED ORDER — METHYLPHENIDATE HCL ER (OSM) 36 MG PO TBCR: 36.0000 mg | EXTENDED_RELEASE_TABLET | Freq: Every day | ORAL | 0 refills | Status: DC

## 2023-08-03 NOTE — Telephone Encounter (Signed)
Pt called in stating that he has contacted several pharmacies local and not local for his Dexmethylphenidate HCl (FOCALIN XR) 35 MG CP24 and no one has it. Pt is wondering if his medication can be changed to something else in the mean time. Pt last seen 08/01/23 follow up scheduled 11/01/23. Please advise.

## 2023-08-03 NOTE — Telephone Encounter (Signed)
Spoke with pt advised of Dr Tenny Craw message, pt verbalized understanding

## 2023-08-06 ENCOUNTER — Telehealth (HOSPITAL_COMMUNITY): Payer: Self-pay

## 2023-08-06 NOTE — Telephone Encounter (Signed)
received fax that a prior auth was needed for the methylphendate

## 2023-08-06 NOTE — Telephone Encounter (Signed)
Received notice from [email or web address] that the prior auth was approved.

## 2023-08-06 NOTE — Telephone Encounter (Signed)
went online to [email or web address] and submitted the prior auth . - pending 

## 2023-09-05 ENCOUNTER — Other Ambulatory Visit (HOSPITAL_COMMUNITY): Payer: Self-pay | Admitting: Psychiatry

## 2023-09-05 ENCOUNTER — Telehealth (HOSPITAL_COMMUNITY): Payer: Self-pay

## 2023-09-05 DIAGNOSIS — F902 Attention-deficit hyperactivity disorder, combined type: Secondary | ICD-10-CM

## 2023-09-05 MED ORDER — METHYLPHENIDATE HCL ER (OSM) 36 MG PO TBCR: 36.0000 mg | EXTENDED_RELEASE_TABLET | Freq: Every day | ORAL | 0 refills | Status: DC

## 2023-09-05 MED ORDER — DEXMETHYLPHENIDATE HCL ER 35 MG PO CP24
1.0000 | ORAL_CAPSULE | Freq: Every morning | ORAL | 0 refills | Status: DC
Start: 2023-09-05 — End: 2023-11-01

## 2023-09-05 NOTE — Telephone Encounter (Signed)
Pt aware rx sent verbalized understanding

## 2023-09-05 NOTE — Telephone Encounter (Signed)
sent 

## 2023-09-05 NOTE — Telephone Encounter (Signed)
Pt called in requesting a refill on his methylphenidate (CONCERTA) 36 MG PO CR tablet to be sent to [facility] in [place]. Last filled 08/03/23, pt scheduled 11/01/23. Please advise.

## 2023-09-05 NOTE — Telephone Encounter (Signed)
Per pt he has a hard time finding focalin and medication was changed last month to Concerta. [facility] states [facility] has it in stock. Please send in Concerta.

## 2023-10-04 ENCOUNTER — Telehealth (HOSPITAL_COMMUNITY): Payer: Self-pay | Admitting: *Deleted

## 2023-10-04 ENCOUNTER — Other Ambulatory Visit (HOSPITAL_COMMUNITY): Payer: Self-pay | Admitting: Psychiatry

## 2023-10-04 MED ORDER — METHYLPHENIDATE HCL ER (OSM) 36 MG PO TBCR: 36.0000 mg | EXTENDED_RELEASE_TABLET | Freq: Every day | ORAL | 0 refills | Status: DC

## 2023-10-04 NOTE — Telephone Encounter (Signed)
Patient called stating he is needing refills for his Concerta sent to [facility] in [place].

## 2023-10-04 NOTE — Telephone Encounter (Signed)
sent 

## 2023-10-08 ENCOUNTER — Other Ambulatory Visit (HOSPITAL_COMMUNITY): Payer: Self-pay | Admitting: Psychiatry

## 2023-10-08 ENCOUNTER — Telehealth (HOSPITAL_COMMUNITY): Payer: Self-pay | Admitting: *Deleted

## 2023-10-08 DIAGNOSIS — F902 Attention-deficit hyperactivity disorder, combined type: Secondary | ICD-10-CM

## 2023-10-08 MED ORDER — DEXMETHYLPHENIDATE HCL ER 35 MG PO CP24
1.0000 | ORAL_CAPSULE | Freq: Every morning | ORAL | 0 refills | Status: DC
Start: 2023-10-08 — End: 2023-11-01

## 2023-10-08 NOTE — Telephone Encounter (Signed)
Patient called stating he would like to switch back to Focalin because his original pharmacy has it back in stock. Per pt he would like to take the Focalin due to his body being use to it anyway's.   Staff called [facility] to verify to see if patient picked up the Concerta and they stated they did not fill it for patient yet and he have not picked it up.   Per pt he would like to go back on Focalin XR and would like for it to go to [facility] in [place].

## 2023-11-01 ENCOUNTER — Telehealth (HOSPITAL_COMMUNITY): Payer: Medicare Other | Admitting: Psychiatry

## 2023-11-01 ENCOUNTER — Encounter (HOSPITAL_COMMUNITY): Payer: Self-pay | Admitting: Psychiatry

## 2023-11-01 DIAGNOSIS — F902 Attention-deficit hyperactivity disorder, combined type: Secondary | ICD-10-CM

## 2023-11-01 MED ORDER — GUANFACINE HCL ER 4 MG PO TB24
4.0000 mg | ORAL_TABLET | Freq: Every morning | ORAL | 2 refills | Status: DC
Start: 2023-11-01 — End: 2024-01-28

## 2023-11-01 MED ORDER — DEXMETHYLPHENIDATE HCL ER 35 MG PO CP24: 1.0000 | ORAL_CAPSULE | Freq: Every morning | ORAL | 0 refills | Status: DC

## 2023-11-01 MED ORDER — DEXMETHYLPHENIDATE HCL ER 35 MG PO CP24
1.0000 | ORAL_CAPSULE | Freq: Every morning | ORAL | 0 refills | Status: DC
Start: 2023-11-01 — End: 2024-01-28

## 2023-11-01 NOTE — Progress Notes (Signed)
Virtual Visit via Video Note  I connected with Aaron Dean on 11/01/23 at  1:40 PM EST by a video enabled telemedicine application and verified that I am speaking with the correct person using two identifiers.  Location: Patient: home Provider: office   I discussed the limitations of evaluation and management by telemedicine and the availability of in person appointments. The patient expressed understanding and agreed to proceed.      I discussed the assessment and treatment plan with the patient. The patient was provided an opportunity to ask questions and all were answered. The patient agreed with the plan and demonstrated an understanding of the instructions.   The patient was advised to call back or seek an in-person evaluation if the symptoms worsen or if the condition fails to improve as anticipated.  I provided 15 minutes of non-face-to-face time during this encounter.   [name], MD  [facility] MD/PA/NP OP Progress Note  11/01/2023 1:57 PM Aaron Dean  MRN:  [id number]  Chief Complaint:  Chief Complaint  Patient presents with   ADHD   Agitation   Follow-up   HPI: This patient is a 27 year old black male who lives with his mother grandmother and 2 uncles in [place].  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  The patient returns for follow-up after 3 months regarding his ADHD.  Overall he is doing well.  For a while his pharmacy ran out of Focalin XR but now he is back on it and he is able to focus and pay attention well.  The guanfacine continues to help him with his temper.  His mood is good and he is getting along well with his family and his coworkers.  He is sleeping well and trying to get some exercise. Visit Diagnosis:    ICD-10-CM   1. ADHD (attention deficit hyperactivity disorder), combined type  F90.2 guanFACINE (INTUNIV) 4 MG TB24 ER tablet    Dexmethylphenidate HCl (FOCALIN XR) 35 MG CP24    Dexmethylphenidate HCl (FOCALIN XR) 35 MG CP24     Dexmethylphenidate HCl (FOCALIN XR) 35 MG CP24      Past Psychiatric History: Long-term outpatient treatment  Past Medical History:  Past Medical History:  Diagnosis Date   ADHD (attention deficit hyperactivity disorder)    Asthma    seasonal related   Bronchitis    Chromosomal abnormality    Diabetes mellitus type II    Oppositional defiant disorder    Premature birth    Seasonal allergies    Sleep apnoea, obstructed     Past Surgical History:  Procedure Laterality Date   TONSILLECTOMY AND ADENOIDECTOMY     Tubes in ears during early childhood      Family Psychiatric History: Long-term outpatient treatment  Family History:  Family History  Problem Relation Age of Onset   Alcohol abuse Father    Drug abuse Father    Autism spectrum disorder Cousin    Seizures Paternal Uncle    Anxiety disorder Maternal Grandmother    ADD / ADHD Neg Hx    Bipolar disorder Neg Hx    Dementia Neg Hx    Depression Neg Hx    OCD Neg Hx    Paranoid behavior Neg Hx    Schizophrenia Neg Hx    Physical abuse Neg Hx    Sexual abuse Neg Hx     Social History:  Social History   Socioeconomic History   Marital status: Single    Spouse  name: Not on file   Number of children: Not on file   Years of education: Not on file   Highest education level: Not on file  Occupational History   Not on file  Tobacco Use   Smoking status: Never   Smokeless tobacco: Never  Substance and Sexual Activity   Alcohol use: No   Drug use: No   Sexual activity: Never  Other Topics Concern   Not on file  Social History Narrative   Not on file   Social Determinants of Health   Financial Resource Strain: Not on file  Food Insecurity: Not on file  Transportation Needs: Not on file  Physical Activity: Not on file  Stress: Not on file  Social Connections: Not on file    Allergies:  Allergies  Allergen Reactions   Abilify [Aripiprazole] Other (See Comments)    Altered mental state    Amoxicillin-Pot Clavulanate Rash    Metabolic Disorder Labs: No results found for: "HGBA1C", "MPG" No results found for: "PROLACTIN" No results found for: "CHOL", "TRIG", "HDL", "CHOLHDL", "VLDL", "LDLCALC" No results found for: "TSH"  Therapeutic Level Labs: No results found for: "LITHIUM" No results found for: "VALPROATE" No results found for: "CBMZ"  Current Medications: Current Outpatient Medications  Medication Sig Dispense Refill   cetirizine (ZYRTEC) 10 MG tablet Take 10 mg by mouth daily.     Dexmethylphenidate HCl (FOCALIN XR) 35 MG CP24 Take 1 capsule by mouth every morning. 30 capsule 0   Dexmethylphenidate HCl (FOCALIN XR) 35 MG CP24 Take 1 capsule by mouth every morning. 30 capsule 0   Dexmethylphenidate HCl (FOCALIN XR) 35 MG CP24 Take 1 capsule by mouth every morning. 30 capsule 0   guaiFENesin (DIABETIC TUSSIN EX) 100 MG/5ML liquid Take 200 mg by mouth once as needed. For cough/cold     guanFACINE (INTUNIV) 4 MG TB24 ER tablet Take 1 tablet (4 mg total) by mouth every morning. 30 tablet 2   metFORMIN (GLUCOPHAGE-XR) 500 MG 24 hr tablet Take by mouth.     pravastatin (PRAVACHOL) 40 MG tablet Take by mouth.     tretinoin (RETIN-A) 0.025 % cream Apply 1 application topically daily as needed. For skin breakouts     No current facility-administered medications for this visit.     Musculoskeletal: Strength & Muscle Tone: within normal limits Gait & Station: normal Patient leans: N/A  Psychiatric Specialty Exam: Review of Systems  All other systems reviewed and are negative.   There were no vitals taken for this visit.There is no height or weight on file to calculate BMI.  General Appearance: Casual and Fairly Groomed  Eye Contact:  Good  Speech:  Clear and Coherent  Volume:  Normal  Mood:  Euthymic  Affect:  Congruent  Thought Process:  Goal Directed  Orientation:  Full (Time, Place, and Person)  Thought Content: wnls  Suicidal Thoughts:  No  Homicidal  Thoughts:  No  Memory:  Immediate;   Good Recent;   Good Remote;   NA  Judgement:  Good  Insight:  Fair  Psychomotor Activity:  Normal  Concentration:  Concentration: Good and Attention Span: Good  Recall:  Good  Fund of Knowledge: Good  Language: Good  Akathisia:  No  Handed:  Right  AIMS (if indicated): not done  Assets:  Communication Skills Desire for Improvement Physical Health Resilience Social Support  ADL's:  Intact  Cognition: Impaired,  Mild  Sleep:  Good   Screenings:   Assessment and Plan: This  patient is a 27 year old male with a history of learning delays ADHD and temper outburst in the past.  He does very well on his current regimen so he will continue Focalin XR 35 mg every morning for ADHD and guanfacine 4 mg daily for focus and agitation.  He will return to see me in 3 months  Collaboration of Care: Collaboration of Care: Primary Care Provider AEB notes are shared with PCP on the epic system  Patient/Guardian was advised Release of Information must be obtained prior to any record release in order to collaborate their care with an outside provider. Patient/Guardian was advised if they have not already done so to contact the registration department to sign all necessary forms in order for [place] to release information regarding their care.   Consent: Patient/Guardian gives verbal consent for treatment and assignment of benefits for services provided during this visit. Patient/Guardian expressed understanding and agreed to proceed.    [name], MD 11/01/2023, 1:57 PM

## 2024-01-04 ENCOUNTER — Telehealth (HOSPITAL_COMMUNITY): Payer: Self-pay

## 2024-01-04 NOTE — Telephone Encounter (Signed)
 Pt [facility] in stating that [facility] 35mg  and he wants it sent to [facility] since they have it in stock. Please advise.

## 2024-01-07 ENCOUNTER — Other Ambulatory Visit (HOSPITAL_COMMUNITY): Payer: Self-pay | Admitting: Psychiatry

## 2024-01-07 DIAGNOSIS — F902 Attention-deficit hyperactivity disorder, combined type: Secondary | ICD-10-CM

## 2024-01-07 MED ORDER — DEXMETHYLPHENIDATE HCL ER 35 MG PO CP24: 1.0000 | ORAL_CAPSULE | Freq: Every morning | ORAL | 0 refills | Status: DC

## 2024-01-07 NOTE — Telephone Encounter (Signed)
 sent

## 2024-01-07 NOTE — Telephone Encounter (Signed)
 Spoke with pt advised of rx being sent to pharmacy he verbalized understanding

## 2024-01-28 ENCOUNTER — Encounter (HOSPITAL_COMMUNITY): Payer: Self-pay | Admitting: Psychiatry

## 2024-01-28 ENCOUNTER — Telehealth (INDEPENDENT_AMBULATORY_CARE_PROVIDER_SITE_OTHER): Payer: Medicare Other | Admitting: Psychiatry

## 2024-01-28 DIAGNOSIS — F902 Attention-deficit hyperactivity disorder, combined type: Secondary | ICD-10-CM | POA: Diagnosis not present

## 2024-01-28 MED ORDER — DEXMETHYLPHENIDATE HCL ER 35 MG PO CP24
1.0000 | ORAL_CAPSULE | Freq: Every morning | ORAL | 0 refills | Status: DC
Start: 2024-01-28 — End: 2024-04-21

## 2024-01-28 MED ORDER — GUANFACINE HCL ER 4 MG PO TB24
4.0000 mg | ORAL_TABLET | Freq: Every morning | ORAL | 2 refills | Status: DC
Start: 2024-01-28 — End: 2024-04-21

## 2024-01-28 MED ORDER — DEXMETHYLPHENIDATE HCL ER 35 MG PO CP24: 1.0000 | ORAL_CAPSULE | Freq: Every morning | ORAL | 0 refills | Status: DC

## 2024-01-28 NOTE — Progress Notes (Signed)
Virtual Visit via Video Note  I connected with Aaron Dean on 01/28/24 at  1:00 PM EST by a video enabled telemedicine application and verified that I am speaking with the correct person using two identifiers.  Location: Patient: home Provider: office   I discussed the limitations of evaluation and management by telemedicine and the availability of in person appointments. The patient expressed understanding and agreed to proceed.     I discussed the assessment and treatment plan with the patient. The patient was provided an opportunity to ask questions and all were answered. The patient agreed with the plan and demonstrated an understanding of the instructions.   The patient was advised to call back or seek an in-person evaluation if the symptoms worsen or if the condition fails to improve as anticipated.  I provided 20 minutes of non-face-to-face time during this encounter.   Aaron Ruder, MD  [facility] MD/PA/NP OP Progress Note  01/28/2024 1:12 PM Aaron Dean  MRN:  [id number]  Chief Complaint:  Chief Complaint  Patient presents with   ADHD   Follow-up   HPI: This patient is a 28 year old black male who lives with his mother grandmother and 2 uncles in [place]. He works as a Arboriculturist at [employer]. The patient returns for follow-up after 3 months regarding his ADHD.  Overall he is doing well.  He just got over the flu and is feeling back to normal.  He has been focusing well on his medications.  He is doing well on his job.  He is getting along well with his family and coworkers.  He is sleeping well.  He denies any outbursts or agitation.  Visit Diagnosis:    ICD-10-CM   1. ADHD (attention deficit hyperactivity disorder), combined type  F90.2 Dexmethylphenidate HCl (FOCALIN XR) 35 MG CP24    Dexmethylphenidate HCl (FOCALIN XR) 35 MG CP24    Dexmethylphenidate HCl (FOCALIN XR) 35 MG CP24    guanFACINE (INTUNIV) 4 MG TB24 ER tablet      Past Psychiatric History:  Long-term outpatient treatment  Past Medical History:  Past Medical History:  Diagnosis Date   ADHD (attention deficit hyperactivity disorder)    Asthma    seasonal related   Bronchitis    Chromosomal abnormality    Diabetes mellitus type II    Oppositional defiant disorder    Premature birth    Seasonal allergies    Sleep apnoea, obstructed     Past Surgical History:  Procedure Laterality Date   TONSILLECTOMY AND ADENOIDECTOMY     Tubes in ears during early childhood      Family Psychiatric History: See below  Family History:  Family History  Problem Relation Age of Onset   Alcohol abuse Father    Drug abuse Father    Autism spectrum disorder Cousin    Seizures Paternal Uncle    Anxiety disorder Maternal Grandmother    ADD / ADHD Neg Hx    Bipolar disorder Neg Hx    Dementia Neg Hx    Depression Neg Hx    OCD Neg Hx    Paranoid behavior Neg Hx    Schizophrenia Neg Hx    Physical abuse Neg Hx    Sexual abuse Neg Hx     Social History:  Social History   Socioeconomic History   Marital status: Single    Spouse name: Not on file   Number of children: Not on file   Years of education: Not on file  Highest education level: Not on file  Occupational History   Not on file  Tobacco Use   Smoking status: Never   Smokeless tobacco: Never  Substance and Sexual Activity   Alcohol use: No   Drug use: No   Sexual activity: Never  Other Topics Concern   Not on file  Social History Narrative   Not on file   Social Drivers of Health   Financial Resource Strain: Not on file  Food Insecurity: Not on file  Transportation Needs: Not on file  Physical Activity: Not on file  Stress: Not on file  Social Connections: Not on file    Allergies:  Allergies  Allergen Reactions   Abilify [Aripiprazole] Other (See Comments)    Altered mental state   Amoxicillin-Pot Clavulanate Rash    Metabolic Disorder Labs: No results found for: "HGBA1C", "MPG" No results  found for: "PROLACTIN" No results found for: "CHOL", "TRIG", "HDL", "CHOLHDL", "VLDL", "LDLCALC" No results found for: "TSH"  Therapeutic Level Labs: No results found for: "LITHIUM" No results found for: "VALPROATE" No results found for: "CBMZ"  Current Medications: Current Outpatient Medications  Medication Sig Dispense Refill   cetirizine (ZYRTEC) 10 MG tablet Take 10 mg by mouth daily.     Dexmethylphenidate HCl (FOCALIN XR) 35 MG CP24 Take 1 capsule by mouth every morning. 30 capsule 0   Dexmethylphenidate HCl (FOCALIN XR) 35 MG CP24 Take 1 capsule by mouth every morning. 30 capsule 0   Dexmethylphenidate HCl (FOCALIN XR) 35 MG CP24 Take 1 capsule by mouth every morning. 30 capsule 0   guaiFENesin (DIABETIC TUSSIN EX) 100 MG/5ML liquid Take 200 mg by mouth once as needed. For cough/cold     guanFACINE (INTUNIV) 4 MG TB24 ER tablet Take 1 tablet (4 mg total) by mouth every morning. 30 tablet 2   metFORMIN (GLUCOPHAGE-XR) 500 MG 24 hr tablet Take by mouth.     pravastatin (PRAVACHOL) 40 MG tablet Take by mouth.     tretinoin (RETIN-A) 0.025 % cream Apply 1 application topically daily as needed. For skin breakouts     No current facility-administered medications for this visit.     Musculoskeletal: Strength & Muscle Tone: within normal limits Gait & Station: normal Patient leans: N/A  Psychiatric Specialty Exam: Review of Systems  All other systems reviewed and are negative.   There were no vitals taken for this visit.There is no height or weight on file to calculate BMI.  General Appearance: Casual and Fairly Groomed  Eye Contact:  Good  Speech:  Clear and Coherent  Volume:  Normal  Mood:  Euthymic  Affect:  Congruent  Thought Process:  Goal Directed  Orientation:  Full (Time, Place, and Person)  Thought Content: WDL   Suicidal Thoughts:  No  Homicidal Thoughts:  No  Memory:  Immediate;   Good Recent;   Fair Remote;   NA  Judgement:  Fair  Insight:  Shallow   Psychomotor Activity:  Normal  Concentration:  Concentration: Good and Attention Span: Good  Recall:  Good  Fund of Knowledge: Good  Language: Good  Akathisia:  No  Handed:  Right  AIMS (if indicated): not done  Assets:  Communication Skills Desire for Improvement Physical Health Resilience Social Support  ADL's:  Intact  Cognition: Impaired,  Mild  Sleep:  Good   Screenings:   Assessment and Plan: This patient is a 28 year old male with a history of learning delays ADHD and temper outburst in the past.  He does  well on his current regimen.  He will continue Focalin XR 35 mg every morning for ADHD and guanfacine 4 mg daily for focus and agitation.  He will return to see me in 3 months  Collaboration of Care: Collaboration of Care: Primary Care Provider AEB notes will be shared with PCP at patient's request  Patient/Guardian was advised Release of Information must be obtained prior to any record release in order to collaborate their care with an outside provider. Patient/Guardian was advised if they have not already done so to contact the registration department to sign all necessary forms in order for [place] to release information regarding their care.   Consent: Patient/Guardian gives verbal consent for treatment and assignment of benefits for services provided during this visit. Patient/Guardian expressed understanding and agreed to proceed.    Aaron Ruder, MD 01/28/2024, 1:12 PM

## 2024-04-21 ENCOUNTER — Encounter (HOSPITAL_COMMUNITY): Payer: Self-pay | Admitting: Psychiatry

## 2024-04-21 ENCOUNTER — Telehealth (INDEPENDENT_AMBULATORY_CARE_PROVIDER_SITE_OTHER): Payer: Medicare Other | Admitting: Psychiatry

## 2024-04-21 DIAGNOSIS — F902 Attention-deficit hyperactivity disorder, combined type: Secondary | ICD-10-CM

## 2024-04-21 MED ORDER — DEXMETHYLPHENIDATE HCL ER 35 MG PO CP24: 1.0000 | ORAL_CAPSULE | Freq: Every morning | ORAL | 0 refills | Status: DC

## 2024-04-21 MED ORDER — GUANFACINE HCL ER 4 MG PO TB24: 4.0000 mg | ORAL_TABLET | Freq: Every morning | ORAL | 2 refills | Status: DC

## 2024-04-21 NOTE — Progress Notes (Signed)
 Virtual Visit via Video Note  I connected with Aaron Dean on 04/21/24 at  1:00 PM EDT by a video enabled telemedicine application and verified that I am speaking with the correct person using two identifiers.  Location: Patient: home Provider: office   I discussed the limitations of evaluation and management by telemedicine and the availability of in person appointments. The patient expressed understanding and agreed to proceed.      I discussed the assessment and treatment plan with the patient. The patient was provided an opportunity to ask questions and all were answered. The patient agreed with the plan and demonstrated an understanding of the instructions.   The patient was advised to call back or seek an in-person evaluation if the symptoms worsen or if the condition fails to improve as anticipated.  I provided 20 minutes of non-face-to-face time during this encounter.   Alfredia Annas, MD  [facility] MD/PA/NP OP Progress Note  04/21/2024 1:07 PM Aaron Dean  MRN:  [id number]  Chief Complaint:  Chief Complaint  Patient presents with   ADHD   Agitation   Follow-up   HPI: This patient is a 28 year old black male who lives with his mother grandmother and 2 uncles in [place]. He works as a Arboriculturist at [employer].   The patient returns for follow-up after 3 months regarding his ADHD and agitation.  He continues to do well.  He has been working a lot of hours at [employer].  He started going to [employer] as well.  He has been focusing well on his medications and is getting along well with others.  He has not had any outbursts.  He is sleeping well. Visit Diagnosis:    ICD-10-CM   1. ADHD (attention deficit hyperactivity disorder), combined type  F90.2 guanFACINE  (INTUNIV ) 4 MG TB24 ER tablet    Dexmethylphenidate  HCl (FOCALIN  XR) 35 MG CP24    Dexmethylphenidate  HCl (FOCALIN  XR) 35 MG CP24    Dexmethylphenidate  HCl (FOCALIN  XR) 35 MG CP24      Past Psychiatric  History: Long-term outpatient treatment  Past Medical History:  Past Medical History:  Diagnosis Date   ADHD (attention deficit hyperactivity disorder)    Asthma    seasonal related   Bronchitis    Chromosomal abnormality    Diabetes mellitus type II    Oppositional defiant disorder    Premature birth    Seasonal allergies    Sleep apnoea, obstructed     Past Surgical History:  Procedure Laterality Date   TONSILLECTOMY AND ADENOIDECTOMY     Tubes in ears during early childhood      Family Psychiatric History: See below  Family History:  Family History  Problem Relation Age of Onset   Alcohol abuse Father    Drug abuse Father    Autism spectrum disorder Cousin    Seizures Paternal Uncle    Anxiety disorder Maternal Grandmother    ADD / ADHD Neg Hx    Bipolar disorder Neg Hx    Dementia Neg Hx    Depression Neg Hx    OCD Neg Hx    Paranoid behavior Neg Hx    Schizophrenia Neg Hx    Physical abuse Neg Hx    Sexual abuse Neg Hx     Social History:  Social History   Socioeconomic History   Marital status: Single    Spouse name: Not on file   Number of children: Not on file   Years of education: Not  on file   Highest education level: Not on file  Occupational History   Not on file  Tobacco Use   Smoking status: Never   Smokeless tobacco: Never  Substance and Sexual Activity   Alcohol use: No   Drug use: No   Sexual activity: Never  Other Topics Concern   Not on file  Social History Narrative   Not on file   Social Drivers of Health   Financial Resource Strain: Not on file  Food Insecurity: Not on file  Transportation Needs: Not on file  Physical Activity: Not on file  Stress: Not on file  Social Connections: Not on file    Allergies:  Allergies  Allergen Reactions   Abilify [Aripiprazole] Other (See Comments)    Altered mental state   Amoxicillin-Pot Clavulanate Rash    Metabolic Disorder Labs: No results found for: "HGBA1C", "MPG" No  results found for: "PROLACTIN" No results found for: "CHOL", "TRIG", "HDL", "CHOLHDL", "VLDL", "LDLCALC" No results found for: "TSH"  Therapeutic Level Labs: No results found for: "LITHIUM" No results found for: "VALPROATE" No results found for: "CBMZ"  Current Medications: Current Outpatient Medications  Medication Sig Dispense Refill   cetirizine (ZYRTEC) 10 MG tablet Take 10 mg by mouth daily.     Dexmethylphenidate  HCl (FOCALIN  XR) 35 MG CP24 Take 1 capsule by mouth every morning. 30 capsule 0   Dexmethylphenidate  HCl (FOCALIN  XR) 35 MG CP24 Take 1 capsule by mouth every morning. 30 capsule 0   Dexmethylphenidate  HCl (FOCALIN  XR) 35 MG CP24 Take 1 capsule by mouth every morning. 30 capsule 0   guaiFENesin (DIABETIC TUSSIN EX) 100 MG/5ML liquid Take 200 mg by mouth once as needed. For cough/cold     guanFACINE  (INTUNIV ) 4 MG TB24 ER tablet Take 1 tablet (4 mg total) by mouth every morning. 30 tablet 2   metFORMIN  (GLUCOPHAGE -XR) 500 MG 24 hr tablet Take by mouth.     pravastatin (PRAVACHOL) 40 MG tablet Take by mouth.     tretinoin (RETIN-A) 0.025 % cream Apply 1 application topically daily as needed. For skin breakouts     No current facility-administered medications for this visit.     Musculoskeletal: Strength & Muscle Tone: within normal limits Gait & Station: normal Patient leans: N/A  Psychiatric Specialty Exam: Review of Systems  All other systems reviewed and are negative.   There were no vitals taken for this visit.There is no height or weight on file to calculate BMI.  General Appearance: Casual and Fairly Groomed  Eye Contact:  Good  Speech:  Clear and Coherent  Volume:  Normal  Mood:  Euthymic  Affect:  Congruent  Thought Process:  Goal Directed  Orientation:  Full (Time, Place, and Person)  Thought Content: WDL   Suicidal Thoughts:  No  Homicidal Thoughts:  No  Memory:  Immediate;   Good Recent;   Good Remote;   NA  Judgement:  Good  Insight:  Fair   Psychomotor Activity:  Normal  Concentration:  Concentration: Good and Attention Span: Good  Recall:  Good  Fund of Knowledge: Fair  Language: Good  Akathisia:  No  Handed:  Right  AIMS (if indicated): not done  Assets:  Communication Skills Desire for Improvement Physical Health Resilience Social Support  ADL's:  Intact  Cognition: Impaired,  Mild  Sleep:  Good   Screenings:   Assessment and Plan: This patient is a 28 year old male with a history of learning delays ADHD and temper outburst in the  past.  He continues to do well on his current regimen.  He will continue Focalin  XR every morning for ADHD and guanfacine  4 mg daily for focus and agitation.  He will return to see me in 3 months  Collaboration of Care: Collaboration of Care: Primary Care Provider AEB notes will be shared with PCP at patient's request  Patient/Guardian was advised Release of Information must be obtained prior to any record release in order to collaborate their care with an outside provider. Patient/Guardian was advised if they have not already done so to contact the registration department to sign all necessary forms in order for us  to release information regarding their care.   Consent: Patient/Guardian gives verbal consent for treatment and assignment of benefits for services provided during this visit. Patient/Guardian expressed understanding and agreed to proceed.    Alfredia Annas, MD 04/21/2024, 1:07 PM

## 2024-07-16 ENCOUNTER — Telehealth (HOSPITAL_COMMUNITY): Admitting: Psychiatry

## 2024-07-16 ENCOUNTER — Encounter (HOSPITAL_COMMUNITY): Payer: Self-pay | Admitting: Psychiatry

## 2024-07-16 DIAGNOSIS — F902 Attention-deficit hyperactivity disorder, combined type: Secondary | ICD-10-CM | POA: Diagnosis not present

## 2024-07-16 MED ORDER — DEXMETHYLPHENIDATE HCL ER 35 MG PO CP24: 1.0000 | ORAL_CAPSULE | Freq: Every morning | ORAL | 0 refills | Status: DC

## 2024-07-16 MED ORDER — GUANFACINE HCL ER 4 MG PO TB24: 4.0000 mg | ORAL_TABLET | Freq: Every morning | ORAL | 2 refills | Status: DC

## 2024-07-16 MED ORDER — DEXMETHYLPHENIDATE HCL ER 35 MG PO CP24
1.0000 | ORAL_CAPSULE | Freq: Every morning | ORAL | 0 refills | Status: DC
Start: 2024-07-16 — End: 2024-10-30

## 2024-07-16 NOTE — Progress Notes (Signed)
 Virtual Visit via Video Note  I connected with Aaron Dean on 07/16/24 at  1:00 PM EDT by a video enabled telemedicine application and verified that I am speaking with the correct person using two identifiers.  Location: Patient: home Provider: office   I discussed the limitations of evaluation and management by telemedicine and the availability of in person appointments. The patient expressed understanding and agreed to proceed.      I discussed the assessment and treatment plan with the patient. The patient was provided an opportunity to ask questions and all were answered. The patient agreed with the plan and demonstrated an understanding of the instructions.   The patient was advised to call back or seek an in-person evaluation if the symptoms worsen or if the condition fails to improve as anticipated.  I provided 20 minutes of non-face-to-face time during this encounter.   [name], MD  [name] MD/PA/NP OP Progress Note  07/16/2024 1:17 PM Aaron Dean  MRN:  [id number]  Chief Complaint:  Chief Complaint  Patient presents with   ADHD   Agitation   Follow-up   HPI: This patient is a 28 year old black male who lives with his mother grandmother and 2 uncles in [place]. He works as a Arboriculturist at [employer]   The patient returns for follow-up after 3 months regarding his ADHD and agitation.  He continues to do well.  He enjoys working at [employer].  He claims that he is going to go back to working out at [employer]  He denies any difficulties with focus or agitation.  He has not had any temper outbursts.  He is sleeping and eating well. Visit Diagnosis:    ICD-10-CM   1. ADHD (attention deficit hyperactivity disorder), combined type  F90.2 Dexmethylphenidate  HCl (FOCALIN  XR) 35 MG CP24    Dexmethylphenidate  HCl (FOCALIN  XR) 35 MG CP24    Dexmethylphenidate  HCl (FOCALIN  XR) 35 MG CP24    guanFACINE  (INTUNIV ) 4 MG TB24 ER tablet      Past Psychiatric History:  Long-term outpatient treatment  Past Medical History:  Past Medical History:  Diagnosis Date   ADHD (attention deficit hyperactivity disorder)    Asthma    seasonal related   Bronchitis    Chromosomal abnormality    Diabetes mellitus type II    Oppositional defiant disorder    Premature birth    Seasonal allergies    Sleep apnoea, obstructed     Past Surgical History:  Procedure Laterality Date   TONSILLECTOMY AND ADENOIDECTOMY     Tubes in ears during early childhood      Family Psychiatric History: See below  Family History:  Family History  Problem Relation Age of Onset   Alcohol abuse Father    Drug abuse Father    Autism spectrum disorder Cousin    Seizures Paternal Uncle    Anxiety disorder Maternal Grandmother    ADD / ADHD Neg Hx    Bipolar disorder Neg Hx    Dementia Neg Hx    Depression Neg Hx    OCD Neg Hx    Paranoid behavior Neg Hx    Schizophrenia Neg Hx    Physical abuse Neg Hx    Sexual abuse Neg Hx     Social History:  Social History   Socioeconomic History   Marital status: Single    Spouse name: Not on file   Number of children: Not on file   Years of education: Not on file  Highest education level: Not on file  Occupational History   Not on file  Tobacco Use   Smoking status: Never   Smokeless tobacco: Never  Substance and Sexual Activity   Alcohol use: No   Drug use: No   Sexual activity: Never  Other Topics Concern   Not on file  Social History Narrative   Not on file   Social Drivers of Health   Financial Resource Strain: Not on file  Food Insecurity: Not on file  Transportation Needs: Not on file  Physical Activity: Not on file  Stress: Not on file  Social Connections: Not on file    Allergies:  Allergies  Allergen Reactions   Abilify [Aripiprazole] Other (See Comments)    Altered mental state   Amoxicillin-Pot Clavulanate Rash    Metabolic Disorder Labs: No results found for: HGBA1C, MPG No results  found for: PROLACTIN No results found for: CHOL, TRIG, HDL, CHOLHDL, VLDL, LDLCALC No results found for: TSH  Therapeutic Level Labs: No results found for: LITHIUM No results found for: VALPROATE No results found for: CBMZ  Current Medications: Current Outpatient Medications  Medication Sig Dispense Refill   cetirizine (ZYRTEC) 10 MG tablet Take 10 mg by mouth daily.     Dexmethylphenidate  HCl (FOCALIN  XR) 35 MG CP24 Take 1 capsule by mouth every morning. 30 capsule 0   Dexmethylphenidate  HCl (FOCALIN  XR) 35 MG CP24 Take 1 capsule by mouth every morning. 30 capsule 0   Dexmethylphenidate  HCl (FOCALIN  XR) 35 MG CP24 Take 1 capsule by mouth every morning. 30 capsule 0   guaiFENesin (DIABETIC TUSSIN EX) 100 MG/5ML liquid Take 200 mg by mouth once as needed. For cough/cold     guanFACINE  (INTUNIV ) 4 MG TB24 ER tablet Take 1 tablet (4 mg total) by mouth every morning. 30 tablet 2   metFORMIN  (GLUCOPHAGE -XR) 500 MG 24 hr tablet Take by mouth.     pravastatin (PRAVACHOL) 40 MG tablet Take by mouth.     tretinoin (RETIN-A) 0.025 % cream Apply 1 application topically daily as needed. For skin breakouts     No current facility-administered medications for this visit.     Musculoskeletal: Strength & Muscle Tone: within normal limits Gait & Station: normal Patient leans: N/A  Psychiatric Specialty Exam: Review of Systems  All other systems reviewed and are negative.   There were no vitals taken for this visit.There is no height or weight on file to calculate BMI.  General Appearance: Casual and Fairly Groomed  Eye Contact:  Good  Speech:  Clear and Coherent  Volume:  Normal  Mood:  Euthymic  Affect:  Congruent  Thought Process:  Goal Directed  Orientation:  Full (Time, Place, and Person)  Thought Content: WDL   Suicidal Thoughts:  No  Homicidal Thoughts:  No  Memory:  Immediate;   Good Recent;   Good Remote;   Fair  Judgement:  Good  Insight:  Fair   Psychomotor Activity:  Normal  Concentration:  Concentration: Good and Attention Span: Good  Recall:  Good  Fund of Knowledge: Fair  Language: Good  Akathisia:  No  Handed:  Right  AIMS (if indicated): not done  Assets:  Communication Skills Desire for Improvement Physical Health Resilience Social Support  ADL's:  Intact  Cognition: Impaired,  Mild  Sleep:  Good   Screenings:   Assessment and Plan: This patient is a 28 year old male with a history of learning delays ADHD and temper outburst in the past.  He continues  to do well on his current regimen.  He will continue Focalin  XR 35 mg every morning for ADHD and guanfacine  4 mg daily for focus and agitation.  He will return to see me in 3 months  Collaboration of Care: Collaboration of Care: Primary Care Provider AEB notes will be shared with PCP at patient's request  Patient/Guardian was advised Release of Information must be obtained prior to any record release in order to collaborate their care with an outside provider. Patient/Guardian was advised if they have not already done so to contact the registration department to sign all necessary forms in order for us  to release information regarding their care.   Consent: Patient/Guardian gives verbal consent for treatment and assignment of benefits for services provided during this visit. Patient/Guardian expressed understanding and agreed to proceed.    [name], MD 07/16/2024, 1:17 PM

## 2024-10-21 ENCOUNTER — Telehealth (HOSPITAL_COMMUNITY): Admitting: Psychiatry

## 2024-10-29 ENCOUNTER — Telehealth (HOSPITAL_COMMUNITY): Admitting: Psychiatry

## 2024-10-30 ENCOUNTER — Telehealth (INDEPENDENT_AMBULATORY_CARE_PROVIDER_SITE_OTHER): Admitting: Psychiatry

## 2024-10-30 ENCOUNTER — Encounter (HOSPITAL_COMMUNITY): Payer: Self-pay | Admitting: Psychiatry

## 2024-10-30 VITALS — BP 133/88 | HR 100 | Ht 67.0 in | Wt 209.0 lb

## 2024-10-30 DIAGNOSIS — F39 Unspecified mood [affective] disorder: Secondary | ICD-10-CM

## 2024-10-30 DIAGNOSIS — F902 Attention-deficit hyperactivity disorder, combined type: Secondary | ICD-10-CM

## 2024-10-30 MED ORDER — DEXMETHYLPHENIDATE HCL ER 35 MG PO CP24: 1.0000 | ORAL_CAPSULE | Freq: Every morning | ORAL | 0 refills | Status: DC

## 2024-10-30 MED ORDER — GUANFACINE HCL ER 4 MG PO TB24: 4.0000 mg | ORAL_TABLET | Freq: Every morning | ORAL | 2 refills | Status: DC

## 2024-10-30 NOTE — Progress Notes (Signed)
 [facility] MD/PA/NP OP Progress Note  10/30/2024 9:13 AM Aaron Dean  MRN:  [id number]  Chief Complaint:  Chief Complaint  Patient presents with   ADHD   Follow-up   HPI: This patient is a 28year-old black male who lives with his mother grandmother and 2 uncles in [place]. He works as a arboriculturist at [employer]   Apparently the patient was born at 28 weeks due to placenta disruption. He was in the NICU for 4 months. He had delayed milestones and needed speech and physical therapy. 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 and diabetes type 2.  The patient used to have a lot of disruptive agitated behaviors but he is doing much better now on a combination of Focalin  and guanfacine .  The patient returns for follow-up after 3 months regarding his ADHD and episodic mood disorder.  He states he has been doing very well.  He continues to work at [employer].  He is getting out and walking or playing basketball occasionally.  He states that he has not had any temper flareups and his focus is good.  He is eating and sleeping well and trying to watch his weight.  He has lost about 8 pounds Visit Diagnosis:    ICD-10-CM   1. ADHD (attention deficit hyperactivity disorder), combined type  F90.2 Dexmethylphenidate  HCl (FOCALIN  XR) 35 MG CP24    Dexmethylphenidate  HCl (FOCALIN  XR) 35 MG CP24    Dexmethylphenidate  HCl (FOCALIN  XR) 35 MG CP24    guanFACINE  (INTUNIV ) 4 MG TB24 ER tablet    2. Episodic mood disorder  F39       Past Psychiatric History: Long-term outpatient treatment  Past Medical History:  Past Medical History:  Diagnosis Date   ADHD (attention deficit hyperactivity disorder)    Asthma    seasonal related   Bronchitis    Chromosomal abnormality    Diabetes mellitus type II    Oppositional defiant disorder    Premature birth    Seasonal allergies    Sleep apnoea, obstructed     Past Surgical History:  Procedure  Laterality Date   TONSILLECTOMY AND ADENOIDECTOMY     Tubes in ears during early childhood      Family Psychiatric History: See below  Family History:  Family History  Problem Relation Age of Onset   Alcohol abuse Father    Drug abuse Father    Autism spectrum disorder Cousin    Seizures Paternal Uncle    Anxiety disorder Maternal Grandmother    ADD / ADHD Neg Hx    Bipolar disorder Neg Hx    Dementia Neg Hx    Depression Neg Hx    OCD Neg Hx    Paranoid behavior Neg Hx    Schizophrenia Neg Hx    Physical abuse Neg Hx    Sexual abuse Neg Hx     Social History:  Social History   Socioeconomic History   Marital status: Single    Spouse name: Not on file   Number of children: Not on file   Years of education: Not on file   Highest education level: Not on file  Occupational History   Not on file  Tobacco Use   Smoking status: Never   Smokeless tobacco: Never  Substance and Sexual Activity   Alcohol use: No   Drug use: No   Sexual activity: Never  Other Topics Concern   Not  on file  Social History Narrative   Not on file   Social Drivers of Health   Financial Resource Strain: Not on file  Food Insecurity: Not on file  Transportation Needs: Not on file  Physical Activity: Not on file  Stress: Not on file  Social Connections: Not on file    Allergies:  Allergies  Allergen Reactions   Abilify [Aripiprazole] Other (See Comments)    Altered mental state   Amoxicillin-Pot Clavulanate Rash    Metabolic Disorder Labs: No results found for: HGBA1C, MPG No results found for: PROLACTIN No results found for: CHOL, TRIG, HDL, CHOLHDL, VLDL, LDLCALC No results found for: TSH  Therapeutic Level Labs: No results found for: LITHIUM No results found for: VALPROATE No results found for: CBMZ  Current Medications: Current Outpatient Medications  Medication Sig Dispense Refill   cetirizine (ZYRTEC) 10 MG tablet Take 10 mg by mouth  daily.     guaiFENesin (DIABETIC TUSSIN EX) 100 MG/5ML liquid Take 200 mg by mouth once as needed. For cough/cold     metFORMIN  (GLUCOPHAGE -XR) 500 MG 24 hr tablet Take by mouth.     pravastatin (PRAVACHOL) 40 MG tablet Take by mouth.     tretinoin (RETIN-A) 0.025 % cream Apply 1 application topically daily as needed. For skin breakouts     Dexmethylphenidate  HCl (FOCALIN  XR) 35 MG CP24 Take 1 capsule by mouth every morning. 30 capsule 0   Dexmethylphenidate  HCl (FOCALIN  XR) 35 MG CP24 Take 1 capsule by mouth every morning. 30 capsule 0   Dexmethylphenidate  HCl (FOCALIN  XR) 35 MG CP24 Take 1 capsule by mouth every morning. 30 capsule 0   guanFACINE  (INTUNIV ) 4 MG TB24 ER tablet Take 1 tablet (4 mg total) by mouth every morning. 30 tablet 2   No current facility-administered medications for this visit.     Musculoskeletal: Strength & Muscle Tone: within normal limits Gait & Station: normal Patient leans: N/A  Psychiatric Specialty Exam: Review of Systems  All other systems reviewed and are negative.   Blood pressure 133/88, pulse 100, height 5' 7 (1.702 m), weight 209 lb (94.8 kg), SpO2 96%.Body mass index is 32.73 kg/m.  General Appearance: Casual and Fairly Groomed  Eye Contact:  Fair  Speech:  Clear and Coherent  Volume:  Normal  Mood:  Euthymic  Affect:  Congruent  Thought Process:  Goal Directed  Orientation:  Full (Time, Place, and Person)  Thought Content: WDL   Suicidal Thoughts:  No  Homicidal Thoughts:  No  Memory:  Immediate;   Good Recent;   Good Remote;   NA  Judgement:  Good  Insight:  Fair  Psychomotor Activity:  Normal  Concentration:  Concentration: Good and Attention Span: Good  Recall:  Good  Fund of Knowledge: Fair  Language: Good  Akathisia:  No  Handed:  Right  AIMS (if indicated): not done  Assets:  Communication Skills Desire for Improvement Physical Health Resilience Social Support  ADL's:  Intact  Cognition: Impaired,  Mild  Sleep:   Good   Screenings: GAD-7    Flowsheet Row Video Visit from 10/30/2024 in [facility] at [place]  Total GAD-7 Score 0   PHQ2-9    Flowsheet Row Video Visit from 10/30/2024 in [facility] Total Score 0  PHQ-9 Total Score 0     Assessment and Plan: This patient is a 28 year old male with a history of learning delays ADHD combined type and a  past history of temper outbursts.  He is stable on his current regimen.  He will continue Focalin  XR 35 mg every morning for ADHD and guanfacine  4 mg daily for focus and agitation.  He will return to see me in 3 months  Collaboration of Care: Collaboration of Care: Primary Care Provider AEB notes will be released to PCP at patient's request  Patient/Guardian was advised Release of Information must be obtained prior to any record release in order to collaborate their care with an outside provider. Patient/Guardian was advised if they have not already done so to contact the registration department to sign all necessary forms in order for us  to release information regarding their care.   Consent: Patient/Guardian gives verbal consent for treatment and assignment of benefits for services provided during this visit. Patient/Guardian expressed understanding and agreed to proceed.    [name], MD 10/30/2024, 9:13 AM

## 2025-01-28 ENCOUNTER — Telehealth (HOSPITAL_COMMUNITY): Payer: Self-pay

## 2025-01-28 ENCOUNTER — Other Ambulatory Visit (HOSPITAL_COMMUNITY): Payer: Self-pay | Admitting: Psychiatry

## 2025-01-28 DIAGNOSIS — F902 Attention-deficit hyperactivity disorder, combined type: Secondary | ICD-10-CM

## 2025-01-28 MED ORDER — DEXMETHYLPHENIDATE HCL ER 35 MG PO CP24: 1.0000 | ORAL_CAPSULE | Freq: Every morning | ORAL | 0 refills | Status: DC

## 2025-01-28 MED ORDER — DEXMETHYLPHENIDATE HCL ER 35 MG PO CP24: 1.0000 | ORAL_CAPSULE | Freq: Every morning | ORAL | 0 refills | Status: AC

## 2025-01-28 NOTE — Telephone Encounter (Signed)
 Please send to [facility] not [facility]

## 2025-01-28 NOTE — Telephone Encounter (Signed)
 Erroneous entry

## 2025-01-28 NOTE — Telephone Encounter (Signed)
 Pt called in requesting a refill on his Dexmethylphenidate  HCl (FOCALIN  XR) 35 MG CP24 sent to [facility] pt scheduled 01/29/25. Please advise as he is out

## 2025-01-28 NOTE — Telephone Encounter (Signed)
 Pt aware.

## 2025-01-28 NOTE — Telephone Encounter (Signed)
 sent

## 2025-01-29 ENCOUNTER — Telehealth (HOSPITAL_COMMUNITY): Admitting: Psychiatry

## 2025-01-29 ENCOUNTER — Encounter (HOSPITAL_COMMUNITY): Payer: Self-pay | Admitting: Psychiatry

## 2025-01-29 DIAGNOSIS — F902 Attention-deficit hyperactivity disorder, combined type: Secondary | ICD-10-CM

## 2025-01-29 DIAGNOSIS — F39 Unspecified mood [affective] disorder: Secondary | ICD-10-CM

## 2025-01-29 MED ORDER — GUANFACINE HCL ER 4 MG PO TB24: 4.0000 mg | ORAL_TABLET | Freq: Every morning | ORAL | 2 refills | Status: AC

## 2025-01-29 MED ORDER — DEXMETHYLPHENIDATE HCL ER 35 MG PO CP24: 1.0000 | ORAL_CAPSULE | Freq: Every morning | ORAL | 0 refills | Status: AC

## 2025-01-29 NOTE — Progress Notes (Signed)
 Virtual Visit via Video Note  I connected with Aaron Dean on 01/29/25 at  9:20 AM EST by a video enabled telemedicine application and verified that I am speaking with the correct person using two identifiers.  Location: Patient: home Provider: office   I discussed the limitations of evaluation and management by telemedicine and the availability of in person appointments. The patient expressed understanding and agreed to proceed.    I discussed the assessment and treatment plan with the patient. The patient was provided an opportunity to ask questions and all were answered. The patient agreed with the plan and demonstrated an understanding of the instructions.   The patient was advised to call back or seek an in-person evaluation if the symptoms worsen or if the condition fails to improve as anticipated.  I provided 20 minutes of non-face-to-face time during this encounter.   [name], MD  [facility] MD/PA/NP OP Progress Note  01/29/2025 9:24 AM Aaron Dean  MRN:  [id number]  Chief Complaint:  Chief Complaint  Patient presents with   ADHD   Follow-up   HPI:  This patient is a 29year-old black male who lives with his mother grandmother and 2 uncles in [place]. He works as a arboriculturist at [employer]   Apparently the patient was born at 28 weeks due to placenta disruption. He was in the NICU for 4 months. He had delayed milestones and needed speech and physical therapy. 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 and diabetes type 2.  The patient used to have a lot of disruptive agitated behaviors but he is doing much better now on a combination of Focalin  and guanfacine .   The patient returns for follow-up after 3 months regarding his ADHD and episodic mood disorder.  He continues to do well.  He has not been able to work much due to the snowstorms but plans to get back to it next week.  His mood has been good and he is  not had any temper outbursts.  He is eating and sleeping well.  He is still focusing well by his report Visit Diagnosis:    ICD-10-CM   1. Episodic mood disorder  F39     2. ADHD (attention deficit hyperactivity disorder), combined type  F90.2 Dexmethylphenidate  HCl (FOCALIN  XR) 35 MG CP24    Dexmethylphenidate  HCl (FOCALIN  XR) 35 MG CP24    guanFACINE  (INTUNIV ) 4 MG TB24 ER tablet      Past Psychiatric History: Long-term outpatient treatment  Past Medical History:  Past Medical History:  Diagnosis Date   ADHD (attention deficit hyperactivity disorder)    Asthma    seasonal related   Bronchitis    Chromosomal abnormality    Diabetes mellitus type II    Oppositional defiant disorder    Premature birth    Seasonal allergies    Sleep apnoea, obstructed     Past Surgical History:  Procedure Laterality Date   TONSILLECTOMY AND ADENOIDECTOMY     Tubes in ears during early childhood      Family Psychiatric History: See below  Family History:  Family History  Problem Relation Age of Onset   Alcohol abuse Father    Drug abuse Father    Autism spectrum disorder Cousin    Seizures Paternal Uncle    Anxiety disorder Maternal Grandmother    ADD / ADHD Neg Hx    Bipolar disorder Neg Hx  Dementia Neg Hx    Depression Neg Hx    OCD Neg Hx    Paranoid behavior Neg Hx    Schizophrenia Neg Hx    Physical abuse Neg Hx    Sexual abuse Neg Hx     Social History:  Social History   Socioeconomic History   Marital status: Single    Spouse name: Not on file   Number of children: Not on file   Years of education: Not on file   Highest education level: Not on file  Occupational History   Not on file  Tobacco Use   Smoking status: Never   Smokeless tobacco: Never  Substance and Sexual Activity   Alcohol use: No   Drug use: No   Sexual activity: Never  Other Topics Concern   Not on file  Social History Narrative   Not on file   Social Drivers of Health   Tobacco  Use: Low Risk (01/29/2025)   Patient History    Smoking Tobacco Use: Never    Smokeless Tobacco Use: Never    Passive Exposure: Not on file  Financial Resource Strain: Not on file  Food Insecurity: Not on file  Transportation Needs: Not on file  Physical Activity: Not on file  Stress: Not on file  Social Connections: Not on file  Depression (PHQ2-9): Low Risk (10/30/2024)   Depression (PHQ2-9)    PHQ-2 Score: 0  Alcohol Screen: Not on file  Housing: Not on file  Utilities: Not on file  Health Literacy: Not on file    Allergies: Allergies[1]  Metabolic Disorder Labs: No results found for: HGBA1C, MPG No results found for: PROLACTIN No results found for: CHOL, TRIG, HDL, CHOLHDL, VLDL, LDLCALC No results found for: TSH  Therapeutic Level Labs: No results found for: LITHIUM No results found for: VALPROATE No results found for: CBMZ  Current Medications: Current Outpatient Medications  Medication Sig Dispense Refill   cetirizine (ZYRTEC) 10 MG tablet Take 10 mg by mouth daily.     Dexmethylphenidate  HCl (FOCALIN  XR) 35 MG CP24 Take 1 capsule by mouth every morning. 30 capsule 0   Dexmethylphenidate  HCl (FOCALIN  XR) 35 MG CP24 Take 1 capsule by mouth every morning. 30 capsule 0   Dexmethylphenidate  HCl (FOCALIN  XR) 35 MG CP24 Take 1 capsule by mouth every morning. 30 capsule 0   guaiFENesin (DIABETIC TUSSIN EX) 100 MG/5ML liquid Take 200 mg by mouth once as needed. For cough/cold     guanFACINE  (INTUNIV ) 4 MG TB24 ER tablet Take 1 tablet (4 mg total) by mouth every morning. 30 tablet 2   metFORMIN  (GLUCOPHAGE -XR) 500 MG 24 hr tablet Take by mouth.     pravastatin (PRAVACHOL) 40 MG tablet Take by mouth.     tretinoin (RETIN-A) 0.025 % cream Apply 1 application topically daily as needed. For skin breakouts     No current facility-administered medications for this visit.     Musculoskeletal: Strength & Muscle Tone: within normal limits Gait &  Station: normal Patient leans: N/A  Psychiatric Specialty Exam: Review of Systems  All other systems reviewed and are negative.   There were no vitals taken for this visit.There is no height or weight on file to calculate BMI.  General Appearance: Casual and Fairly Groomed  Eye Contact:  Good  Speech:  Clear and Coherent  Volume:  Normal  Mood:  Euthymic  Affect:  Congruent  Thought Process:  Goal Directed  Orientation:  Full (Time, Place, and Person)  Thought  Content: WDL   Suicidal Thoughts:  No  Homicidal Thoughts:  No  Memory:  Immediate;   Good Recent;   Good Remote;   NA  Judgement:  Fair  Insight:  Shallow  Psychomotor Activity:  Normal  Concentration:  Concentration: Good and Attention Span: Good  Recall:  Good  Fund of Knowledge: Fair  Language: Good  Akathisia:  No  Handed:  Right  AIMS (if indicated): not done  Assets:  Communication Skills Desire for Improvement Physical Health Resilience Social Support  ADL's:  Intact  Cognition: Impaired,  Mild  Sleep:  Good   Screenings: GAD-7    Flowsheet Row Video Visit from 10/30/2024 in [facility] at [place]  Total GAD-7 Score 0   PHQ2-9    Flowsheet Row Video Visit from 10/30/2024 in [facility] at [facility] Total Score 0  PHQ-9 Total Score 0     Assessment and Plan: This patient is a 29 year old male with a history of learning delays ADHD combined type and a past history of temper outbursts.  He remains stable on his current regimen.  He will continue Focalin  XR 35 mg every morning for ADHD and guanfacine  4 mg daily for agitation.  He will return to see me in 3 months  Collaboration of Care: Collaboration of Care: Primary Care Provider AEB notes will be shared with PCP at patient's request  Patient/Guardian was advised Release of Information must be obtained prior to any record release in order to collaborate their care with an outside  provider. Patient/Guardian was advised if they have not already done so to contact the registration department to sign all necessary forms in order for us  to release information regarding their care.   Consent: Patient/Guardian gives verbal consent for treatment and assignment of benefits for services provided during this visit. Patient/Guardian expressed understanding and agreed to proceed.    [name], MD 01/29/2025, 9:24 AM     [1]  Allergies Allergen Reactions   Abilify [Aripiprazole] Other (See Comments)    Altered mental state   Amoxicillin-Pot Clavulanate Rash

## 2025-04-28 ENCOUNTER — Telehealth (HOSPITAL_COMMUNITY): Admitting: Psychiatry
